# Patient Record
Sex: Female | Born: 1985 | Race: White | Hispanic: No | Marital: Married | Smoking: Never smoker
Health system: Southern US, Community
[De-identification: ages and names within clinical notes are randomized; demographics above are authoritative.]

## PROBLEM LIST (undated history)

## (undated) ENCOUNTER — Inpatient Hospital Stay (HOSPITAL_COMMUNITY): Payer: Self-pay

## (undated) DIAGNOSIS — R519 Headache, unspecified: Secondary | ICD-10-CM

## (undated) DIAGNOSIS — O24419 Gestational diabetes mellitus in pregnancy, unspecified control: Secondary | ICD-10-CM

## (undated) DIAGNOSIS — K219 Gastro-esophageal reflux disease without esophagitis: Secondary | ICD-10-CM

## (undated) DIAGNOSIS — F419 Anxiety disorder, unspecified: Secondary | ICD-10-CM

## (undated) DIAGNOSIS — L509 Urticaria, unspecified: Secondary | ICD-10-CM

## (undated) DIAGNOSIS — E282 Polycystic ovarian syndrome: Secondary | ICD-10-CM

## (undated) DIAGNOSIS — F32A Depression, unspecified: Secondary | ICD-10-CM

## (undated) HISTORY — DX: Gestational diabetes mellitus in pregnancy, unspecified control: O24.419

## (undated) HISTORY — PX: WISDOM TOOTH EXTRACTION: SHX21

## (undated) HISTORY — DX: Urticaria, unspecified: L50.9

## (undated) HISTORY — DX: Polycystic ovarian syndrome: E28.2

---

## 2014-05-06 ENCOUNTER — Other Ambulatory Visit (HOSPITAL_COMMUNITY): Payer: Self-pay | Admitting: Obstetrics & Gynecology

## 2014-05-06 DIAGNOSIS — Z3141 Encounter for fertility testing: Secondary | ICD-10-CM

## 2014-05-07 ENCOUNTER — Ambulatory Visit (HOSPITAL_COMMUNITY): Payer: Self-pay

## 2014-05-07 ENCOUNTER — Ambulatory Visit (HOSPITAL_COMMUNITY)
Admission: RE | Admit: 2014-05-07 | Discharge: 2014-05-07 | Disposition: A | Discharge status: Home / Self Care | Payer: BC Managed Care – PPO | Source: Ambulatory Visit | Attending: Obstetrics & Gynecology | Admitting: Obstetrics & Gynecology

## 2014-05-07 DIAGNOSIS — Z3141 Encounter for fertility testing: Secondary | ICD-10-CM | POA: Diagnosis not present

## 2014-05-07 DIAGNOSIS — N979 Female infertility, unspecified: Secondary | ICD-10-CM | POA: Diagnosis present

## 2014-05-07 MED ORDER — IOHEXOL 300 MG/ML  SOLN
20.0000 mL | Freq: Once | INTRAMUSCULAR | Status: AC | PRN
  Administered 2014-05-07: 20 mL

## 2014-05-10 NOTE — L&D Delivery Note (Signed)
Delivery Note At 11:55 AM a viable female was delivered via Vaginal, Spontaneous Delivery (Presentation: ;  ).  APGAR: 8 9 weight pending Placenta status: Intact, Manual removal.  Cord: 3 vessels with the following complications: None.  Cord pH: *not obtained Tight nuchal cord x 1  Anesthesia:   Episiotomy: None Lacerations: 1st degree Suture Repair: 3.0 chromic Est. Blood Loss (mL):  300  Mom to postpartum.  Baby to Couplet care / Skin to Skin   NICU present for delivery.  Merril Nagy L 03/16/2015, 12:07 PM

## 2014-08-29 LAB — OB RESULTS CONSOLE ABO/RH: RH Type: POSITIVE

## 2014-08-29 LAB — OB RESULTS CONSOLE GC/CHLAMYDIA
Chlamydia: NEGATIVE
Gonorrhea: NEGATIVE

## 2014-08-29 LAB — OB RESULTS CONSOLE RPR: RPR: NONREACTIVE

## 2014-08-29 LAB — OB RESULTS CONSOLE HEPATITIS B SURFACE ANTIGEN: Hepatitis B Surface Ag: NEGATIVE

## 2014-08-29 LAB — OB RESULTS CONSOLE ANTIBODY SCREEN: ANTIBODY SCREEN: NEGATIVE

## 2014-08-29 LAB — OB RESULTS CONSOLE RUBELLA ANTIBODY, IGM: RUBELLA: IMMUNE

## 2014-08-29 LAB — OB RESULTS CONSOLE HIV ANTIBODY (ROUTINE TESTING): HIV: NONREACTIVE

## 2015-01-29 ENCOUNTER — Encounter: Payer: BLUE CROSS/BLUE SHIELD | Attending: Obstetrics & Gynecology

## 2015-01-29 VITALS — Ht 66.0 in | Wt 200.9 lb

## 2015-01-29 DIAGNOSIS — Z713 Dietary counseling and surveillance: Secondary | ICD-10-CM | POA: Insufficient documentation

## 2015-01-29 DIAGNOSIS — O24419 Gestational diabetes mellitus in pregnancy, unspecified control: Secondary | ICD-10-CM | POA: Diagnosis not present

## 2015-01-30 NOTE — Progress Notes (Signed)
  Patient was seen on 01/29/15 for Gestational Diabetes self-management . The following learning objectives were met by the patient :   States the definition of Gestational Diabetes  States why dietary management is important in controlling blood glucose  Describes the effects of carbohydrates on blood glucose levels  Demonstrates ability to create a balanced meal plan  Demonstrates carbohydrate counting   States when to check blood glucose levels  Demonstrates proper blood glucose monitoring techniques  States the effect of stress and exercise on blood glucose levels  States the importance of limiting caffeine and abstaining from alcohol and smoking  Plan:  Aim for 2 Carb Choices per meal (30 grams) +/- 1 either way for breakfast Aim for 3 Carb Choices per meal (45 grams) +/- 1 either way from lunch and dinner Aim for 1-2 Carbs per snack Begin reading food labels for Total Carbohydrate and sugar grams of foods Consider  increasing your activity level by walking daily as tolerated Begin checking BG before breakfast and 1-2 hours after first bit of breakfast, lunch and dinner after  as directed by MD  Take medication  as directed by MD  Blood glucose monitor given: One Touch Verio Flex Lot # [id number] X Exp: 02/2016 Blood glucose reading: $RemoveBeforeDE'134mg'aVTugARGPAlkaTA$ /dl  Patient instructed to monitor glucose levels: FBS: 60 - <90 1 hour: <140 2 hour: <120  Patient received the following handouts:  Nutrition Diabetes and Pregnancy  Carbohydrate Counting List  Meal Planning worksheet  Patient will be seen for follow-up as needed.

## 2015-03-13 ENCOUNTER — Encounter (HOSPITAL_COMMUNITY): Payer: Self-pay | Admitting: *Deleted

## 2015-03-13 ENCOUNTER — Inpatient Hospital Stay (HOSPITAL_COMMUNITY): Payer: BLUE CROSS/BLUE SHIELD

## 2015-03-13 ENCOUNTER — Inpatient Hospital Stay (EMERGENCY_DEPARTMENT_HOSPITAL)
Admission: AD | Admit: 2015-03-13 | Discharge: 2015-03-13 | Disposition: A | Discharge status: Home / Self Care | Payer: BLUE CROSS/BLUE SHIELD | Source: Ambulatory Visit | Attending: Obstetrics and Gynecology | Admitting: Obstetrics and Gynecology

## 2015-03-13 DIAGNOSIS — O99213 Obesity complicating pregnancy, third trimester: Secondary | ICD-10-CM

## 2015-03-13 DIAGNOSIS — Z3689 Encounter for other specified antenatal screening: Secondary | ICD-10-CM

## 2015-03-13 DIAGNOSIS — O42913 Preterm premature rupture of membranes, unspecified as to length of time between rupture and onset of labor, third trimester: Secondary | ICD-10-CM | POA: Diagnosis not present

## 2015-03-13 DIAGNOSIS — O24419 Gestational diabetes mellitus in pregnancy, unspecified control: Secondary | ICD-10-CM

## 2015-03-13 DIAGNOSIS — O368390 Maternal care for abnormalities of the fetal heart rate or rhythm, unspecified trimester, not applicable or unspecified: Secondary | ICD-10-CM

## 2015-03-13 DIAGNOSIS — IMO0002 Reserved for concepts with insufficient information to code with codable children: Secondary | ICD-10-CM

## 2015-03-13 DIAGNOSIS — Z3A35 35 weeks gestation of pregnancy: Secondary | ICD-10-CM

## 2015-03-13 HISTORY — DX: Gestational diabetes mellitus in pregnancy, unspecified control: O24.419

## 2015-03-13 MED ORDER — SODIUM CHLORIDE 0.9 % IV SOLN: INTRAVENOUS | Status: DC

## 2015-03-13 NOTE — MAU Note (Signed)
Pt sent from MD office for 2 decelerations down to the 60's, pt states she has intermittent vaginal pain, denies bleeding or LOF.

## 2015-03-13 NOTE — Discharge Instructions (Signed)
Fetal Movement Counts  Patient Name: __________________________________________________ Patient Due Date: ____________________  Performing a fetal movement count is highly recommended in high-risk pregnancies, but it is good for every pregnant woman to do. Your health care provider may ask you to start counting fetal movements at 28 weeks of the pregnancy. Fetal movements often increase:  · After eating a full meal.  · After physical activity.  · After eating or drinking something sweet or cold.  · At rest.  Pay attention to when you feel the baby is most active. This will help you notice a pattern of your baby's sleep and wake cycles and what factors contribute to an increase in fetal movement. It is important to perform a fetal movement count at the same time each day when your baby is normally most active.   HOW TO COUNT FETAL MOVEMENTS  1. Find a quiet and comfortable area to sit or lie down on your left side. Lying on your left side provides the best blood and oxygen circulation to your baby.  2. Write down the day and time on a sheet of paper or in a journal.  3. Start counting kicks, flutters, swishes, rolls, or jabs in a 2-hour period. You should feel at least 10 movements within 2 hours.  4. If you do not feel 10 movements in 2 hours, wait 2-3 hours and count again. Look for a change in the pattern or not enough counts in 2 hours.  SEEK MEDICAL CARE IF:  · You feel less than 10 counts in 2 hours, tried twice.  · There is no movement in over an hour.  · The pattern is changing or taking longer each day to reach 10 counts in 2 hours.  · You feel the baby is not moving as he or she usually does.  Date: ____________ Movements: ____________ Start time: ____________ Finish time: ____________   Date: ____________ Movements: ____________ Start time: ____________ Finish time: ____________  Date: ____________ Movements: ____________ Start time: ____________ Finish time: ____________  Date: ____________ Movements:  ____________ Start time: ____________ Finish time: ____________  Date: ____________ Movements: ____________ Start time: ____________ Finish time: ____________  Date: ____________ Movements: ____________ Start time: ____________ Finish time: ____________  Date: ____________ Movements: ____________ Start time: ____________ Finish time: ____________  Date: ____________ Movements: ____________ Start time: ____________ Finish time: ____________   Date: ____________ Movements: ____________ Start time: ____________ Finish time: ____________  Date: ____________ Movements: ____________ Start time: ____________ Finish time: ____________  Date: ____________ Movements: ____________ Start time: ____________ Finish time: ____________  Date: ____________ Movements: ____________ Start time: ____________ Finish time: ____________  Date: ____________ Movements: ____________ Start time: ____________ Finish time: ____________  Date: ____________ Movements: ____________ Start time: ____________ Finish time: ____________  Date: ____________ Movements: ____________ Start time: ____________ Finish time: ____________   Date: ____________ Movements: ____________ Start time: ____________ Finish time: ____________  Date: ____________ Movements: ____________ Start time: ____________ Finish time: ____________  Date: ____________ Movements: ____________ Start time: ____________ Finish time: ____________  Date: ____________ Movements: ____________ Start time: ____________ Finish time: ____________  Date: ____________ Movements: ____________ Start time: ____________ Finish time: ____________  Date: ____________ Movements: ____________ Start time: ____________ Finish time: ____________  Date: ____________ Movements: ____________ Start time: ____________ Finish time: ____________   Date: ____________ Movements: ____________ Start time: ____________ Finish time: ____________  Date: ____________ Movements: ____________ Start time: ____________ Finish  time: ____________  Date: ____________ Movements: ____________ Start time: ____________ Finish time: ____________  Date: ____________ Movements: ____________ Start time:   ____________ Finish time: ____________  Date: ____________ Movements: ____________ Start time: ____________ Finish time: ____________  Date: ____________ Movements: ____________ Start time: ____________ Finish time: ____________  Date: ____________ Movements: ____________ Start time: ____________ Finish time: ____________   Date: ____________ Movements: ____________ Start time: ____________ Finish time: ____________  Date: ____________ Movements: ____________ Start time: ____________ Finish time: ____________  Date: ____________ Movements: ____________ Start time: ____________ Finish time: ____________  Date: ____________ Movements: ____________ Start time: ____________ Finish time: ____________  Date: ____________ Movements: ____________ Start time: ____________ Finish time: ____________  Date: ____________ Movements: ____________ Start time: ____________ Finish time: ____________  Date: ____________ Movements: ____________ Start time: ____________ Finish time: ____________   Date: ____________ Movements: ____________ Start time: ____________ Finish time: ____________  Date: ____________ Movements: ____________ Start time: ____________ Finish time: ____________  Date: ____________ Movements: ____________ Start time: ____________ Finish time: ____________  Date: ____________ Movements: ____________ Start time: ____________ Finish time: ____________  Date: ____________ Movements: ____________ Start time: ____________ Finish time: ____________  Date: ____________ Movements: ____________ Start time: ____________ Finish time: ____________  Date: ____________ Movements: ____________ Start time: ____________ Finish time: ____________   Date: ____________ Movements: ____________ Start time: ____________ Finish time: ____________  Date: ____________  Movements: ____________ Start time: ____________ Finish time: ____________  Date: ____________ Movements: ____________ Start time: ____________ Finish time: ____________  Date: ____________ Movements: ____________ Start time: ____________ Finish time: ____________  Date: ____________ Movements: ____________ Start time: ____________ Finish time: ____________  Date: ____________ Movements: ____________ Start time: ____________ Finish time: ____________  Date: ____________ Movements: ____________ Start time: ____________ Finish time: ____________   Date: ____________ Movements: ____________ Start time: ____________ Finish time: ____________  Date: ____________ Movements: ____________ Start time: ____________ Finish time: ____________  Date: ____________ Movements: ____________ Start time: ____________ Finish time: ____________  Date: ____________ Movements: ____________ Start time: ____________ Finish time: ____________  Date: ____________ Movements: ____________ Start time: ____________ Finish time: ____________  Date: ____________ Movements: ____________ Start time: ____________ Finish time: ____________     This information is not intended to replace advice given to you by your health care provider. Make sure you discuss any questions you have with your health care provider.     Document Released: 05/26/2006 Document Revised: 05/17/2014 Document Reviewed: 02/21/2012  Elsevier Interactive Patient Education ©2016 [employer]

## 2015-03-13 NOTE — MAU Provider Note (Signed)
Chief Complaint:  No chief complaint on file.   None     HPI: April Gamble is a 29 y.o. G1P0 at [redacted]w[redacted]d who presents to maternity admissions sent from the office for variable decelerations down to the 60s seen in the office.  She reports some intermittent cramping but reports this is the same as it has been for recent weeks. She has not required any treatment for her cramping and has not taken any medication. She reports she has not had as much water to drink today as usual.  She reports good fetal movement, denies LOF, vaginal bleeding, vaginal itching/burning, urinary symptoms, h/a, dizziness, n/v, or fever/chills.    HPI  Past Medical History: Past Medical History  Diagnosis Date  . Gestational diabetes mellitus, antepartum   . PCOS (polycystic ovarian syndrome)   . Gestational diabetes     Past obstetric history: OB History  Gravida Para Term Preterm AB SAB TAB Ectopic Multiple Living  1             # Outcome Date GA Lbr Len/2nd Weight Sex Delivery Anes PTL Lv  1 Current               Past Surgical History: Past Surgical History  Procedure Laterality Date  . Wisdom tooth extraction      Family History: History reviewed. No pertinent family history.  Social History: Social History  Substance Use Topics  . Smoking status: Never Smoker   . Smokeless tobacco: None  . Alcohol Use: No    Allergies:  Allergies  Allergen Reactions  . Aleve [Naproxen Sodium] Hives  . Amoxicillin Hives  . Biaxin [Clarithromycin] Hives  . Erythromycin Hives  . Sulfa Antibiotics Hives    Meds:  Prescriptions prior to admission  Medication Sig Dispense Refill Last Dose  . acetaminophen (TYLENOL) 325 MG tablet Take 650 mg by mouth every 6 (six) hours as needed for moderate pain or headache.   03/12/2015 at Unknown time  . glyBURIDE (DIABETA) 2.5 MG tablet Take 5 mg by mouth at bedtime.   03/12/2015 at 1900  . Prenatal Multivit-Min-Fe-FA (PRENATAL VITAMINS PO) Take 1 each by mouth daily.    03/12/2015 at 1900    ROS:  Review of Systems  Constitutional: Negative for fever, chills and fatigue.  HENT: Negative for sinus pressure.   Eyes: Negative for photophobia.  Respiratory: Negative for shortness of breath.   Cardiovascular: Negative for chest pain.  Gastrointestinal: Negative for nausea, vomiting, diarrhea and constipation.  Genitourinary: Negative for dysuria, frequency, flank pain, vaginal bleeding, vaginal discharge, difficulty urinating, vaginal pain and pelvic pain.  Musculoskeletal: Negative for neck pain.  Neurological: Negative for dizziness, weakness and headaches.  Psychiatric/Behavioral: Negative.      I have reviewed patient's Past Medical Hx, Surgical Hx, Family Hx, Social Hx, medications and allergies.   Physical Exam   Patient Vitals for the past 24 hrs:  BP Temp Temp src Pulse Resp  03/13/15 1452 115/70 mmHg 98.2 F (36.8 C) Oral 91 18   Constitutional: Well-developed, well-nourished female in no acute distress.  Cardiovascular: normal rate Respiratory: normal effort GI: Abd soft, non-tender, gravid appropriate for gestational age.  MS: Extremities nontender, no edema, normal ROM Neurologic: Alert and oriented x 4.  GU: Neg CVAT.  PELVIC EXAM: Cervix pink, visually closed, without lesion, scant white creamy discharge, vaginal walls and external genitalia normal Bimanual exam: Cervix 0/long/high, firm, anterior, neg CMT, uterus nontender, nonenlarged, adnexa without tenderness, enlargement, or mass  Dilation: 1  Effacement (%): 50 Presentation: Vertex Exam by:: L. Courtney ParisLeftwich Kirby CNM  FHT:  Baseline 135, moderate variability, accelerations present, no decelerations Contractions: q 2-6 mins, irregular, mild to palpation   Labs: No results found for this or any previous visit (from the past 24 hour(s)).    Imaging:  No results found. BPP 8/8,  EFW 83%tile  MAU Course/MDM: FHR tracing reviewed. Consult Dr Henderson Cloudomblin to review FHR tracing.   BPP and Follow up US for EFW ordered.  Consult Dr Henderson Cloudomblin with normal results, BPP 8/8, EFW 83%tile.  Reviewed normal US results with pt.  Pt stable at time of discharge.  Assessment: 1. Fetal heart deceleration   2. Variable fetal heart rate decelerations, antepartum   3. [redacted] weeks gestation of pregnancy   4. Obesity in pregnancy, antepartum, third trimester   5. Gestational diabetes mellitus in third trimester, unspecified diabetic control   6. NST (non-stress test) reactive     Plan: Discharge home Labor precautions and fetal kick counts     Follow-up Information    [facility] Hem[facility] E, [facility].   Specialty:  Obstetrics and Gynecology   Why:  As scheduled   Contact information:   [address] [place] [id number] [phone]       [facility].   Why:  As needed for emergencies   Contact information:   [address] [id number] mc [place] [id number] [phone]       Medication List    TAKE these medications        acetaminophen 325 MG tablet  Commonly known as:  TYLENOL  Take 650 mg by mouth every 6 (six) hours as needed for moderate pain or headache.     glyBURIDE 2.5 MG tablet  Commonly known as:  DIABETA  Take 5 mg by mouth at bedtime.     PRENATAL VITAMINS PO  Take 1 each by mouth daily.        Sharen CounterLisa Leftwich-Kirby Certified Nurse-Midwife 03/13/2015 6:23 PM

## 2015-03-15 ENCOUNTER — Inpatient Hospital Stay (HOSPITAL_COMMUNITY)
Admission: AD | Admit: 2015-03-15 | Discharge: 2015-03-18 | DRG: 775 | Disposition: A | Discharge status: Home / Self Care | Payer: BLUE CROSS/BLUE SHIELD | Source: Ambulatory Visit | Attending: Obstetrics and Gynecology | Admitting: Obstetrics and Gynecology

## 2015-03-15 ENCOUNTER — Encounter (HOSPITAL_COMMUNITY): Payer: Self-pay | Admitting: *Deleted

## 2015-03-15 DIAGNOSIS — Z823 Family history of stroke: Secondary | ICD-10-CM

## 2015-03-15 DIAGNOSIS — O9962 Diseases of the digestive system complicating childbirth: Secondary | ICD-10-CM | POA: Diagnosis present

## 2015-03-15 DIAGNOSIS — Z3A35 35 weeks gestation of pregnancy: Secondary | ICD-10-CM

## 2015-03-15 DIAGNOSIS — O24425 Gestational diabetes mellitus in childbirth, controlled by oral hypoglycemic drugs: Secondary | ICD-10-CM | POA: Diagnosis present

## 2015-03-15 DIAGNOSIS — K219 Gastro-esophageal reflux disease without esophagitis: Secondary | ICD-10-CM | POA: Diagnosis present

## 2015-03-15 DIAGNOSIS — Z833 Family history of diabetes mellitus: Secondary | ICD-10-CM

## 2015-03-15 DIAGNOSIS — O42913 Preterm premature rupture of membranes, unspecified as to length of time between rupture and onset of labor, third trimester: Principal | ICD-10-CM | POA: Diagnosis present

## 2015-03-15 DIAGNOSIS — Z8249 Family history of ischemic heart disease and other diseases of the circulatory system: Secondary | ICD-10-CM

## 2015-03-15 LAB — POCT FERN TEST: POCT FERN TEST: POSITIVE

## 2015-03-15 NOTE — MAU Note (Signed)
Leaking fld since 1930. States fld is clear with sl yellow tinge. Has odor to it but not of urine. One ctx tonight

## 2015-03-15 NOTE — MAU Note (Signed)
Urine sent to lab @2331 .

## 2015-03-15 NOTE — Progress Notes (Signed)
Fern slide made  

## 2015-03-16 ENCOUNTER — Encounter (HOSPITAL_COMMUNITY): Payer: Self-pay | Admitting: Anesthesiology

## 2015-03-16 ENCOUNTER — Inpatient Hospital Stay (HOSPITAL_COMMUNITY): Payer: BLUE CROSS/BLUE SHIELD | Admitting: Anesthesiology

## 2015-03-16 DIAGNOSIS — O42913 Preterm premature rupture of membranes, unspecified as to length of time between rupture and onset of labor, third trimester: Secondary | ICD-10-CM | POA: Diagnosis present

## 2015-03-16 DIAGNOSIS — K219 Gastro-esophageal reflux disease without esophagitis: Secondary | ICD-10-CM | POA: Diagnosis present

## 2015-03-16 DIAGNOSIS — O24425 Gestational diabetes mellitus in childbirth, controlled by oral hypoglycemic drugs: Secondary | ICD-10-CM | POA: Diagnosis present

## 2015-03-16 DIAGNOSIS — Z8249 Family history of ischemic heart disease and other diseases of the circulatory system: Secondary | ICD-10-CM | POA: Diagnosis not present

## 2015-03-16 DIAGNOSIS — O9962 Diseases of the digestive system complicating childbirth: Secondary | ICD-10-CM | POA: Diagnosis present

## 2015-03-16 DIAGNOSIS — Z3A35 35 weeks gestation of pregnancy: Secondary | ICD-10-CM | POA: Diagnosis not present

## 2015-03-16 DIAGNOSIS — Z833 Family history of diabetes mellitus: Secondary | ICD-10-CM | POA: Diagnosis not present

## 2015-03-16 DIAGNOSIS — Z823 Family history of stroke: Secondary | ICD-10-CM | POA: Diagnosis not present

## 2015-03-16 LAB — OB RESULTS CONSOLE GBS: GBS: NEGATIVE

## 2015-03-16 LAB — CBC
HCT: 37.5 % (ref 36.0–46.0)
HEMOGLOBIN: 12.9 g/dL (ref 12.0–15.0)
MCH: 29.9 pg (ref 26.0–34.0)
MCHC: 34.4 g/dL (ref 30.0–36.0)
MCV: 87 fL (ref 78.0–100.0)
Platelets: 269 10*3/uL (ref 150–400)
RBC: 4.31 MIL/uL (ref 3.87–5.11)
RDW: 13.4 % (ref 11.5–15.5)
WBC: 9.7 10*3/uL (ref 4.0–10.5)

## 2015-03-16 LAB — TYPE AND SCREEN
ABO/RH(D): A POS
Antibody Screen: NEGATIVE

## 2015-03-16 LAB — GROUP B STREP BY PCR: Group B strep by PCR: NEGATIVE

## 2015-03-16 LAB — RPR: RPR: NONREACTIVE

## 2015-03-16 LAB — ABO/RH: ABO/RH(D): A POS

## 2015-03-16 MED ORDER — PHENYLEPHRINE 40 MCG/ML (10ML) SYRINGE FOR IV PUSH (FOR BLOOD PRESSURE SUPPORT): 80.0000 ug | PREFILLED_SYRINGE | INTRAVENOUS | Status: DC | PRN

## 2015-03-16 MED ORDER — PHENYLEPHRINE 40 MCG/ML (10ML) SYRINGE FOR IV PUSH (FOR BLOOD PRESSURE SUPPORT)
80.0000 ug | PREFILLED_SYRINGE | INTRAVENOUS | Status: DC | PRN
  Filled 2015-03-16: qty 20

## 2015-03-16 MED ORDER — TERBUTALINE SULFATE 1 MG/ML IJ SOLN
0.2500 mg | Freq: Once | INTRAMUSCULAR | Status: DC | PRN
Start: 2015-03-16 — End: 2015-03-16

## 2015-03-16 MED ORDER — FLEET ENEMA 7-19 GM/118ML RE ENEM: 1.0000 | ENEMA | RECTAL | Status: DC | PRN

## 2015-03-16 MED ORDER — ONDANSETRON HCL 4 MG PO TABS
4.0000 mg | ORAL_TABLET | ORAL | Status: DC | PRN
Start: 2015-03-16 — End: 2015-03-18

## 2015-03-16 MED ORDER — ACETAMINOPHEN 325 MG PO TABS: 650.0000 mg | ORAL_TABLET | ORAL | Status: DC | PRN

## 2015-03-16 MED ORDER — SENNOSIDES-DOCUSATE SODIUM 8.6-50 MG PO TABS
2.0000 | ORAL_TABLET | ORAL | Status: DC
Start: 2015-03-17 — End: 2015-03-18
  Administered 2015-03-16 – 2015-03-17 (×2): 2 via ORAL
  Filled 2015-03-16 (×2): qty 2

## 2015-03-16 MED ORDER — BUPIVACAINE HCL (PF) 0.25 % IJ SOLN
INTRAMUSCULAR | Status: DC | PRN
  Administered 2015-03-16 (×2): 4 mL via EPIDURAL

## 2015-03-16 MED ORDER — OXYCODONE-ACETAMINOPHEN 5-325 MG PO TABS: 1.0000 | ORAL_TABLET | ORAL | Status: DC | PRN

## 2015-03-16 MED ORDER — BISACODYL 10 MG RE SUPP
10.0000 mg | Freq: Every day | RECTAL | Status: DC | PRN
  Filled 2015-03-16: qty 1

## 2015-03-16 MED ORDER — IBUPROFEN 600 MG PO TABS
600.0000 mg | ORAL_TABLET | Freq: Four times a day (QID) | ORAL | Status: DC
  Administered 2015-03-16 – 2015-03-18 (×8): 600 mg via ORAL
  Filled 2015-03-16 (×8): qty 1

## 2015-03-16 MED ORDER — WITCH HAZEL-GLYCERIN EX PADS: 1.0000 "application " | MEDICATED_PAD | CUTANEOUS | Status: DC | PRN

## 2015-03-16 MED ORDER — DIBUCAINE 1 % RE OINT
1.0000 "application " | TOPICAL_OINTMENT | RECTAL | Status: DC | PRN
  Filled 2015-03-16: qty 28

## 2015-03-16 MED ORDER — MEDROXYPROGESTERONE ACETATE 150 MG/ML IM SUSP: 150.0000 mg | INTRAMUSCULAR | Status: DC | PRN

## 2015-03-16 MED ORDER — LACTATED RINGERS IV SOLN
500.0000 mL | INTRAVENOUS | Status: DC | PRN
  Administered 2015-03-16: 500 mL via INTRAVENOUS

## 2015-03-16 MED ORDER — ONDANSETRON HCL 4 MG/2ML IJ SOLN: 4.0000 mg | INTRAMUSCULAR | Status: DC | PRN

## 2015-03-16 MED ORDER — OXYTOCIN BOLUS FROM INFUSION: 500.0000 mL | INTRAVENOUS | Status: DC

## 2015-03-16 MED ORDER — OXYCODONE-ACETAMINOPHEN 5-325 MG PO TABS
1.0000 | ORAL_TABLET | ORAL | Status: DC | PRN
  Administered 2015-03-17: 1 via ORAL
  Filled 2015-03-16: qty 1

## 2015-03-16 MED ORDER — DIPHENHYDRAMINE HCL 25 MG PO CAPS
25.0000 mg | ORAL_CAPSULE | Freq: Four times a day (QID) | ORAL | Status: DC | PRN
  Administered 2015-03-17: 25 mg via ORAL
  Filled 2015-03-16: qty 1

## 2015-03-16 MED ORDER — EPHEDRINE 5 MG/ML INJ: 10.0000 mg | INTRAVENOUS | Status: DC | PRN

## 2015-03-16 MED ORDER — BENZOCAINE-MENTHOL 20-0.5 % EX AERO
1.0000 "application " | INHALATION_SPRAY | CUTANEOUS | Status: DC | PRN
  Administered 2015-03-17: 1 via TOPICAL
  Filled 2015-03-16 (×2): qty 56

## 2015-03-16 MED ORDER — BUTORPHANOL TARTRATE 1 MG/ML IJ SOLN
2.0000 mg | Freq: Once | INTRAMUSCULAR | Status: AC
  Administered 2015-03-16: 2 mg via INTRAVENOUS
  Filled 2015-03-16: qty 2

## 2015-03-16 MED ORDER — LIDOCAINE-EPINEPHRINE (PF) 2 %-1:200000 IJ SOLN
INTRAMUSCULAR | Status: DC | PRN
  Administered 2015-03-16: 3 mL

## 2015-03-16 MED ORDER — SIMETHICONE 80 MG PO CHEW: 80.0000 mg | CHEWABLE_TABLET | ORAL | Status: DC | PRN

## 2015-03-16 MED ORDER — LIDOCAINE HCL (PF) 1 % IJ SOLN: 30.0000 mL | INTRAMUSCULAR | Status: DC | PRN

## 2015-03-16 MED ORDER — LANOLIN HYDROUS EX OINT: TOPICAL_OINTMENT | CUTANEOUS | Status: DC | PRN

## 2015-03-16 MED ORDER — OXYTOCIN 40 UNITS IN LACTATED RINGERS INFUSION - SIMPLE MED
1.0000 m[IU]/min | INTRAVENOUS | Status: DC
  Administered 2015-03-16: 1 m[IU]/min via INTRAVENOUS
  Filled 2015-03-16: qty 1000

## 2015-03-16 MED ORDER — ONDANSETRON HCL 4 MG/2ML IJ SOLN: 4.0000 mg | Freq: Four times a day (QID) | INTRAMUSCULAR | Status: DC | PRN

## 2015-03-16 MED ORDER — OXYCODONE-ACETAMINOPHEN 5-325 MG PO TABS: 2.0000 | ORAL_TABLET | ORAL | Status: DC | PRN

## 2015-03-16 MED ORDER — FLEET ENEMA 7-19 GM/118ML RE ENEM: 1.0000 | ENEMA | Freq: Every day | RECTAL | Status: DC | PRN

## 2015-03-16 MED ORDER — PRENATAL MULTIVITAMIN CH
1.0000 | ORAL_TABLET | Freq: Every day | ORAL | Status: DC
  Administered 2015-03-17 – 2015-03-18 (×2): 1 via ORAL
  Filled 2015-03-16 (×2): qty 1

## 2015-03-16 MED ORDER — TETANUS-DIPHTH-ACELL PERTUSSIS 5-2.5-18.5 LF-MCG/0.5 IM SUSP
0.5000 mL | Freq: Once | INTRAMUSCULAR | Status: DC
  Filled 2015-03-16: qty 0.5

## 2015-03-16 MED ORDER — DIPHENHYDRAMINE HCL 50 MG/ML IJ SOLN: 12.5000 mg | INTRAMUSCULAR | Status: DC | PRN

## 2015-03-16 MED ORDER — ZOLPIDEM TARTRATE 5 MG PO TABS
5.0000 mg | ORAL_TABLET | Freq: Every evening | ORAL | Status: DC | PRN
Start: 2015-03-16 — End: 2015-03-18

## 2015-03-16 MED ORDER — PROMETHAZINE HCL 25 MG/ML IJ SOLN
12.5000 mg | Freq: Once | INTRAMUSCULAR | Status: AC
  Administered 2015-03-16: 12.5 mg via INTRAVENOUS
  Filled 2015-03-16: qty 1

## 2015-03-16 MED ORDER — FENTANYL 2.5 MCG/ML BUPIVACAINE 1/10 % EPIDURAL INFUSION (WH - ANES)
14.0000 mL/h | INTRAMUSCULAR | Status: DC | PRN
  Administered 2015-03-16: 12 mL/h via EPIDURAL
  Filled 2015-03-16: qty 125

## 2015-03-16 MED ORDER — VANCOMYCIN HCL IN DEXTROSE 1-5 GM/200ML-% IV SOLN
1000.0000 mg | Freq: Two times a day (BID) | INTRAVENOUS | Status: DC
  Filled 2015-03-16 (×2): qty 200

## 2015-03-16 MED ORDER — LACTATED RINGERS IV SOLN
INTRAVENOUS | Status: DC
  Administered 2015-03-16: 01:00:00 via INTRAVENOUS

## 2015-03-16 MED ORDER — OXYTOCIN 40 UNITS IN LACTATED RINGERS INFUSION - SIMPLE MED: 62.5000 mL/h | INTRAVENOUS | Status: DC

## 2015-03-16 MED ORDER — CITRIC ACID-SODIUM CITRATE 334-500 MG/5ML PO SOLN: 30.0000 mL | ORAL | Status: DC | PRN

## 2015-03-16 MED ORDER — FENTANYL 2.5 MCG/ML BUPIVACAINE 1/10 % EPIDURAL INFUSION (WH - ANES): 14.0000 mL/h | INTRAMUSCULAR | Status: DC | PRN

## 2015-03-16 MED ORDER — MEASLES, MUMPS & RUBELLA VAC ~~LOC~~ INJ
0.5000 mL | INJECTION | Freq: Once | SUBCUTANEOUS | Status: DC
  Filled 2015-03-16: qty 0.5

## 2015-03-16 NOTE — Progress Notes (Signed)
Dr Vincente PoliGrewal notified of pt's admission and status. Aware of SROM , clear fld, ctx pattern, reactive tracing. Will admit for induction

## 2015-03-16 NOTE — Anesthesia Preprocedure Evaluation (Addendum)
Anesthesia Evaluation  Patient identified by MRN, date of birth, ID band Patient awake    Reviewed: Allergy & Precautions, Patient's Chart, lab work & pertinent test results  Airway Mallampati: III  TM Distance: >3 FB Neck ROM: Full    Dental no notable dental hx. (+) Teeth Intact   Pulmonary neg pulmonary ROS,    Pulmonary exam normal breath sounds clear to auscultation       Cardiovascular negative cardio ROS Normal cardiovascular exam Rhythm:Regular Rate:Normal     Neuro/Psych negative neurological ROS  negative psych ROS   GI/Hepatic Neg liver ROS, GERD  ,  Endo/Other  diabetes, Well Controlled, Gestational, Oral Hypoglycemic AgentsObesity PCOS  Renal/GU negative Renal ROS  negative genitourinary   Musculoskeletal negative musculoskeletal ROS (+)   Abdominal (+) + obese,   Peds  Hematology negative hematology ROS (+)   Anesthesia Other Findings   Reproductive/Obstetrics (+) Pregnancy                            Anesthesia Physical Anesthesia Plan  ASA: II  Anesthesia Plan: Epidural   Post-op Pain Management:    Induction:   Airway Management Planned: Natural Airway  Additional Equipment:   Intra-op Plan:   Post-operative Plan:   Informed Consent: I have reviewed the patients History and Physical, chart, labs and discussed the procedure including the risks, benefits and alternatives for the proposed anesthesia with the patient or authorized representative who has indicated his/her understanding and acceptance.     Plan Discussed with: Anesthesiologist  Anesthesia Plan Comments:         Anesthesia Quick Evaluation

## 2015-03-16 NOTE — Anesthesia Procedure Notes (Signed)
Epidural Patient location during procedure: OB  Staffing Anesthesiologist: Bryler Dibble Performed by: anesthesiologist   Preanesthetic Checklist Completed: patient identified, surgical consent, pre-op evaluation, timeout performed, IV checked, risks and benefits discussed and monitors and equipment checked  Epidural Patient position: sitting Prep: DuraPrep Patient monitoring: heart rate, cardiac monitor, continuous pulse ox and blood pressure Approach: midline Location: L3-L4 Injection technique: LOR saline  Needle:  Needle type: Tuohy  Needle gauge: 17 G Needle length: 9 cm Needle insertion depth: 6 cm Catheter type: closed end flexible Catheter size: 19 Gauge Catheter at skin depth: 12 cm Test dose: negative and 2% lidocaine with Epi 1:200 K  Assessment Events: blood not aspirated, injection not painful, no injection resistance, negative IV test and no paresthesia  Additional Notes Reason for block:procedure for pain   

## 2015-03-16 NOTE — Progress Notes (Signed)
Patient comfortable after epidural  Cervix is 100%/ 5 cm -1 Vertex Scar tissue stretched Follow labor curve Anticipate nsvd

## 2015-03-16 NOTE — H&P (Signed)
April SprangLaura Hodgkin is a 29 y.o. G 1 P 0 at 35 w 3 days presents with PPROM last night.  Rapid GBBS -  History of LEEP Cone Biopsy of cervix. GDM - on Glyburide Maternal Medical History:  Reason for admission: Rupture of membranes and contractions.     OB History    Gravida Para Term Preterm AB TAB SAB Ectopic Multiple Living   1              Past Medical History  Diagnosis Date  . Gestational diabetes mellitus, antepartum   . PCOS (polycystic ovarian syndrome)   . Gestational diabetes    Past Surgical History  Procedure Laterality Date  . Wisdom tooth extraction     Family History: family history includes Cancer in her father; Diabetes in her father; Heart disease in her maternal grandfather and paternal grandfather; Hypertension in her father and mother; Stroke in her maternal grandfather. Social History:  reports that she has never smoked. She does not have any smokeless tobacco history on file. She reports that she does not drink alcohol or use illicit drugs.   Prenatal Transfer Tool  Maternal Diabetes: Yes:  Diabetes Type:  Insulin/Medication controlled Genetic Screening: Normal Maternal Ultrasounds/Referrals: Normal Fetal Ultrasounds or other Referrals:  None Maternal Substance Abuse:  No Significant Maternal Medications:  None Significant Maternal Lab Results:  None Other Comments:  None  Review of Systems  All other systems reviewed and are negative.   Dilation: 1.5 Effacement (%): 70 Station: -3 Exam by:: a. white rn Blood pressure 117/76, pulse 78, temperature 98 F (36.7 C), temperature source Axillary, resp. rate 18, height 5\' 6"  (1.676 m), weight 199 lb (90.266 kg), last menstrual period 04/30/2014, SpO2 98 %. Maternal Exam:  Abdomen: Fetal presentation: vertex     Fetal Exam Fetal State Assessment: Category I - tracings are normal.     Physical Exam  Nursing note and vitals reviewed. Constitutional: She appears well-developed and well-nourished.   HENT:  Head: Normocephalic.  Eyes: Pupils are equal, round, and reactive to light.  Neck: Normal range of motion.  Cardiovascular: Normal rate and regular rhythm.   Respiratory: Effort normal.   Cervix is now 90% 3 cm -2 Vertex Scar tissue palpated from the LEEP  Prenatal labs: ABO, Rh: --/--/A POS, A POS (11/06 0030) Antibody: NEG (11/06 0030) Rubella: Immune (04/21 0000) RPR: Nonreactive (04/21 0000)  HBsAg: Negative (04/21 0000)  HIV: Non-reactive (04/21 0000)  GBS: Negative (11/06 0230)   Assessment/Plan: IUP at 35 w 3 days PPROM GBBS -  Anticipate NSVD Receiving Epidural now NICU at delivery Risks of Preterm delivery discussed with patient   Sher Shampine L 03/16/2015, 6:42 AM

## 2015-03-16 NOTE — MAU Note (Signed)
Report [facility]Leigh Ann [facility]

## 2015-03-17 LAB — CBC
HEMATOCRIT: 36.7 % (ref 36.0–46.0)
Hemoglobin: 12.6 g/dL (ref 12.0–15.0)
MCH: 30.3 pg (ref 26.0–34.0)
MCHC: 34.3 g/dL (ref 30.0–36.0)
MCV: 88.2 fL (ref 78.0–100.0)
PLATELETS: 220 10*3/uL (ref 150–400)
RBC: 4.16 MIL/uL (ref 3.87–5.11)
RDW: 13.7 % (ref 11.5–15.5)
WBC: 12.6 10*3/uL — AB (ref 4.0–10.5)

## 2015-03-17 LAB — GLUCOSE, CAPILLARY: Glucose-Capillary: 87 mg/dL (ref 65–99)

## 2015-03-17 MED ORDER — HYDROCORTISONE 1 % EX CREA
TOPICAL_CREAM | Freq: Two times a day (BID) | CUTANEOUS | Status: DC | PRN
  Administered 2015-03-17: 21:00:00 via TOPICAL
  Filled 2015-03-17: qty 28

## 2015-03-17 NOTE — Progress Notes (Signed)
Post Partum Day 1 Subjective: no complaints, up ad lib, voiding, tolerating PO, + flatus and baby stable in NICU , on nasal canula  Objective: Blood pressure 103/60, pulse 81, temperature 98.8 F (37.1 C), temperature source Oral, resp. rate 18, height 5\' 6"  (1.676 m), weight 199 lb (90.266 kg), last menstrual period 04/30/2014, SpO2 97 %, unknown if currently breastfeeding.  Physical Exam:  General: alert and cooperative Lochia: appropriate Uterine Fundus: firm Incision: healing well DVT Evaluation: No evidence of DVT seen on physical exam. Negative Homan's sign. No cords or calf tenderness. No significant calf/ankle edema.   Recent Labs  03/16/15 0030 03/17/15 0520  HGB 12.9 12.6  HCT 37.5 36.7    Assessment/Plan: Plan for discharge tomorrow   LOS: 1 day   April Gamble G 03/17/2015, 8:24 AM

## 2015-03-17 NOTE — Anesthesia Postprocedure Evaluation (Signed)
  Anesthesia Post-op Note  Patient: April SprangLaura Gamble  Procedure(s) Performed: * No procedures listed *  Patient Location: PACU and Mother/Baby  Anesthesia Type:Epidural  Level of Consciousness: awake, alert  and oriented  Airway and Oxygen Therapy: Patient Spontanous Breathing  Post-op Pain: none  Post-op Assessment: Post-op Vital signs reviewed, Patient's Cardiovascular Status Stable, No headache, No backache, No residual numbness and No residual motor weakness  Post-op Vital Signs: Reviewed and stable  Complications: No apparent anesthesia complications

## 2015-03-17 NOTE — Clinical Social Work Maternal (Signed)
CLINICAL SOCIAL WORK MATERNAL/CHILD NOTE  Patient Details  Name: April Gamble MRN: [id number] Date of Birth: 07/13/1985  Date:  03/17/2015  Clinical Social Worker Initiating Note:  Nusaiba Guallpa E. 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       Strengths:  Ability to meet basic needs , Compliance with medical plan , Home prepared for child , Understanding of illness   Risk Factors/Current Problems:  Mental Health Concerns  (Hx of Anxiety and Depression: MOB reports it started in 2009 and states it was " definitely situational")   Cognitive State:  Alert , Goal Oriented , Insightful , Linear Thinking    Mood/Affect:  Relaxed , Calm , Comfortable , Interested    CSW Assessment: CSW met with MOB and her sister in her third floor room/319 to introduce services, offer support and complete assessment due to baby's admission to NICU at 35.2 weeks.  MOB stated this was a good time to talk and gave permission for CSW to speak openly with sister present.  MOB was pleasant and reports feeling well at this time.  She provided information regarding her baby's NICU admission/medical needs and appears to have a good understanding of the situation.  She acknowledges  that the separation caused by the admission is not ideal, but states she just wants to "make sure she's okay."  CSW encouraged her to keep this frame of mind as baby's hospitalization continues.  She states she does not want baby to go home and have problems she doesn't know how to handle.  CSW agrees.  MOB states feeling comfortable with baby's care thus far.   MOB feels she is coping well emotionally at this time.  She reports a history of Depression in 2009 and states, "it was definitely situational."  MOB did not appear to want to discuss the situation at that time, but assures CSW that it is in the past.  MOB reports taking Celexa at that time and taking Xanax "on and off since then."  She reports no Xanax use during pregnancy.  CSW provided education on perinatal mood disorders and asked MOB to talk with CSW and or her doctor if she has concerns about her mental health at any time.  She agrees.  CSW also encouraged MOB to speak with a Lactation Consultant about safe medications to take in pregnancy for her awareness.  CSW discussed the importance of maternal mental health, even if it means forgoing breast feeding if an unsafe medication is needed.  MOB stated understanding.  She reports no emotional concerns at this time. MOB states she has a   great support system and talked specifically about her husband, sister, parents and PGM.  She noted frustration with the rest of her husband's family who would not make the trip from [place] to be here while she was in labor.  She states her mother-in-law does not typically drive by herself, but did for the delivery because no one would bring her here.  MOB states they have all necessary supplies for baby, but may need to get some more smaller outfits.  MOB states an awareness of SIDS precautions and was able to list them for CSW.   CSW explained ongoing support services offered by NICU CSW and gave contact information.  CSW has no social concerns at this time.  CSW  Plan/Description:  Patient/Family Education , Psychosocial Support and Ongoing Assessment of Needs    Jada Kuhnert Elizabeth, LCSW 03/17/2015, 12:35 PM 

## 2015-03-17 NOTE — Progress Notes (Signed)
CSW attempted to meet with MOB to complete assessment due to baby's admission to NICU and maternal hx of Anxiety and Depression, but she was not in her room at this time.  CSW will attempt again at a later time.

## 2015-03-17 NOTE — Lactation Note (Signed)
This note was copied from the chart of Girl Cameron SprangLaura Erno. Lactation Consultation Note  Patient Name: Girl Cameron SprangLaura Thiam ZOXWR'UToday's Date: 03/17/2015 Reason for consult: Initial assessment;NICU baby;Late preterm infant;Infant < 6lbs    With this mom of a NICU baby, now 7126 hours old. Mom was using 27 flanges, but I decreased her to 24 on her right breast with a better fit. Mom not getting any colostrum with pumping, but was able to express a few large drops with hand expression, mostly from right breast. Baic pumping teaching done, NICU booklet reviewed, hand expression taught. Mom knows to call for questions/concerns  Maternal Data Formula Feeding for Exclusion: Yes (baby in NICU) Has patient been taught Hand Expression?: Yes Does the patient have breastfeeding experience prior to this delivery?: No  Feeding    LATCH Score/Interventions                      Lactation Tools Discussed/Used Tools: Flanges Flange Size: Other (comment) (mom fitted for size 24 on right, 27 on left) WIC Program: No Pump Review: Setup, frequency, and cleaning;Milk Storage;Other (comment) (prremie setting, hand exp, NICU booklet) Initiated by:: bedside RN Date initiated:: 03/16/15   Consult Status Consult Status: Follow-up Date: 03/18/15 Follow-up type: In-patient    Alfred LevinsLee, Corbet Hanley Anne 03/17/2015, 2:35 PM

## 2015-03-18 NOTE — Discharge Summary (Signed)
Obstetric Discharge Summary Reason for Admission: rupture of membranes Prenatal Procedures: none Intrapartum Procedures: spontaneous vaginal delivery Postpartum Procedures: none Complications-Operative and Postpartum: first degree perineal laceration HEMOGLOBIN  Date Value Ref Range Status  03/17/2015 12.6 12.0 - 15.0 g/dL Final   HCT  Date Value Ref Range Status  03/17/2015 36.7 36.0 - 46.0 % Final    Physical Exam:  General: alert Lochia: appropriate Uterine Fundus: firm Incision: na DVT Evaluation: No evidence of DVT seen on physical exam.  Discharge Diagnoses: Term Pregnancy-delivered  Discharge Information: Date: 03/18/2015 Activity: pelvic rest Diet: routine Medications: PNV and Ibuprofen Condition: stable Instructions: refer to practice specific booklet Discharge to: home   Newborn Data: Live born female  Birth Weight: 5 lb 10.8 oz (2575 g) APGAR: 7, 8  Home with mother.  April Gamble 03/18/2015, 7:50 AM

## 2015-03-18 NOTE — Progress Notes (Signed)
Pt is discharged in the care of husband,with N.T. Escort. Denies any pain or discomfort.Spirts are good. No equipment needed for home use. Infant to remain in [facility].Discharged instructions with Rx were given to pt. Questions asked and answered. Stable.

## 2015-03-18 NOTE — Lactation Note (Signed)
This note was copied from the chart of April Gamble. Lactation Consultation Note  Patient Name: April Gamble PPJKD'T Date: 03/18/2015 Reason for consult: Follow-up assessment;NICU baby NICU baby 58 hours old. Met with mom in NICU at baby's bedside. Mom states that she has a DEBP at home. Enc mom to take her pumping kit with her when she is discharged. Mom aware that there are pumping rooms in NICU with hospital-grade DEBP like she had in her room. Enc mom to keep pumping 8 times/24 hours for 15 minutes followed by hand expression. Mom aware of OP/BFSG and [place] phone line assistance after D/C.   Maternal Data    Feeding    LATCH Score/Interventions                      Lactation Tools Discussed/Used     Consult Status Consult Status: PRN    Inocente Salles 03/18/2015, 12:09 PM

## 2015-03-19 ENCOUNTER — Ambulatory Visit: Payer: Self-pay

## 2015-03-19 NOTE — Lactation Note (Signed)
This note was copied from the chart of April Gamble. Lactation Consultation Note  Patient Name: April Gamble JWJXB'JToday's Date: 03/19/2015 Reason for consult: Follow-up assessment;NICU baby  NICU baby 5970 hours old. Mom states that pumping is going well and her EBM is increasing each time that she pumps. Mom states that she is comfortable with hand expression and that she pumps in NICU while visiting baby. Enc mom to make sure she gets some rest and takes care of herself. Enc mom to call for assistance as needed.  Maternal Data    Feeding    LATCH Score/Interventions                      Lactation Tools Discussed/Used     Consult Status Consult Status: PRN    April Gamble, April Gamble 03/19/2015, 10:38 AM

## 2015-03-21 ENCOUNTER — Ambulatory Visit: Payer: Self-pay

## 2015-03-21 NOTE — Lactation Note (Signed)
This note was copied from the chart of April Cameron SprangLaura Wintle. Lactation Consultation Note  Patient Name: April Gamble GNFAO'ZToday's Date: 03/21/2015 Reason for consult: Follow-up assessment;NICU baby  Baby 395 days old. Mom reports that she is having some trouble and thinks her flanges may be the wrong size. Mom using a #27 flange, so switched mom to #24 flanges and mom reports increased comfort. Enc mom to use coconut oil to reduce friction while pumping. Discussed converting a sports bra for hands-free pumping. Mom reports that her breasts are filling. Discussed engorgement prevention/treatment, and enc pumping 15-20 minutes as needed to soften breasts. Had enc mom to use DEBP hospital-grade pump while at hospital, but mom states that her personal pump works better. Maternal Data    Feeding Feeding Type: Breast Milk Length of feed: 30 min  LATCH Score/Interventions                      Lactation Tools Discussed/Used     Consult Status Consult Status: Follow-up Date: 03/22/15 Follow-up type: In-patient    Geralynn OchsWILLIARD, Vint Pola 03/21/2015, 2:42 PM

## 2015-03-27 ENCOUNTER — Ambulatory Visit: Payer: Self-pay

## 2015-03-27 NOTE — Lactation Note (Signed)
This note was copied from the chart of April Marsena Taff. Lactation Consultation Note  Patient Name: April Gamble WYBRK'V Date: 03/27/2015 Reason for consult: Follow-up assessment;NICU baby  NICU baby 39 days old. Mom reports that she is having trouble with one side of her kit that she uses with the hospital DEBP. Assisted mom with reassembling inner center component, and pump working properly. Mom also states that she thinks she may have a "plugged" area near her nipple. Discussed methods of working this area out. Also enc mom to continue with massage, pumping, hand expression, and warm water in order to remove this area to protect supply. Enc mom to offer to put baby to breast as baby able.  Maternal Data    Feeding Feeding Type: Breast Milk Nipple Type: Slow - flow  LATCH Score/Interventions                      Lactation Tools Discussed/Used     Consult Status Consult Status: PRN    Inocente Salles 03/27/2015, 4:30 PM

## 2015-04-01 ENCOUNTER — Ambulatory Visit: Payer: Self-pay

## 2015-04-01 NOTE — Lactation Note (Signed)
This note was copied from the chart of April Cameron SprangLaura Adams. Lactation Consultation Note  Patient Name: April Gamble ZOXWR'UToday's Date: 04/01/2015 Reason for consult: Follow-up assessment;NICU baby;Infant < 6lbs;Late preterm infant   Follow up with mom in NICU. Mom was holding infant at bedside. Mom reports she is pumping every 3 hours, she reports she slept for 8+hours last night without pumping. She is getting 60-120 cc per pumping. Discussed supply and demand and need to continue pumping every 2-3 hours with one 5-6 hour stretch at night in order to maintain milk volume. Mom reports infant has not been put to breast, encouraged her to discuss with NICU about allowing infant to BF. Enc mom to do STS with infant when visiting. Enc mom to call if assistance needed to place infant to breast.    Maternal Data Formula Feeding for Exclusion: No  Feeding Feeding Type: Breast Milk Nipple Type: Slow - flow Length of feed: 20 min  LATCH Score/Interventions                      Lactation Tools Discussed/Used     Consult Status Consult Status: PRN Follow-up type: Call as needed    Ed BlalockSharon S Zephan Beauchaine 04/01/2015, 1:05 PM

## 2015-04-07 ENCOUNTER — Ambulatory Visit: Payer: Self-pay

## 2015-04-07 NOTE — Lactation Note (Signed)
This note was copied from the chart of Girl Cameron SprangLaura Caligiuri. Lactation Consultation Note  Patient Name: Girl Cameron SprangLaura Matar IONGE'XToday's Date: 04/07/2015 Reason for consult: Follow-up assessment;NICU baby NICU baby, 333 weeks old, 603w4d CGA. Called to NICU to assist mom with obtaining a deeper latch. Baby latched and nursing for about 10 minutes prior to this LC's arrival. Demonstrated to mom how to tug baby's chin and flange lower lip--and mom reported increased comfort. Enc mom to keep baby tucked in tight with cheeks touching her breasts. Removed baby from breasts to check mom's nipple and there was a slight compression stripe. Demonstrated to mom how to latch baby more deeply, and then mom able to achieve deeper latch herself. Checked nipple again, and compression stripe barely visible. Mom states that she is comfortable with this position and her ability to latch baby. Mom reports baby probably coming home tomorrow. Mom aware of OP/BFSG and [facility] phone line assistance after D/C. Mom states that she continues to have a good supply of milk, and she had easily expressible milk throughout this BF.   Maternal Data    Feeding Feeding Type: Breast Fed  LATCH Score/Interventions Latch: Grasps breast easily, tongue down, lips flanged, rhythmical sucking.  Audible Swallowing: A few with stimulation Intervention(s): Hand expression  Type of Nipple: Everted at rest and after stimulation  Comfort (Breast/Nipple): Soft / non-tender     Hold (Positioning): Assistance needed to correctly position infant at breast and maintain latch. Intervention(s): Breastfeeding basics reviewed  LATCH Score: 8  Lactation Tools Discussed/Used     Consult Status Consult Status: PRN    Geralynn OchsWILLIARD, Shiane Wenberg 04/07/2015, 3:05 PM

## 2016-04-16 IMAGING — US US MFM FETAL BPP W/O NON-STRESS
1 series · 14 of 28 positions shown · non-contrast
Comparison: none

[Series 1: us mfm fetal bpp w/o non-stress · 63 acquisitions, 14 frames shown]
[im 3/63]
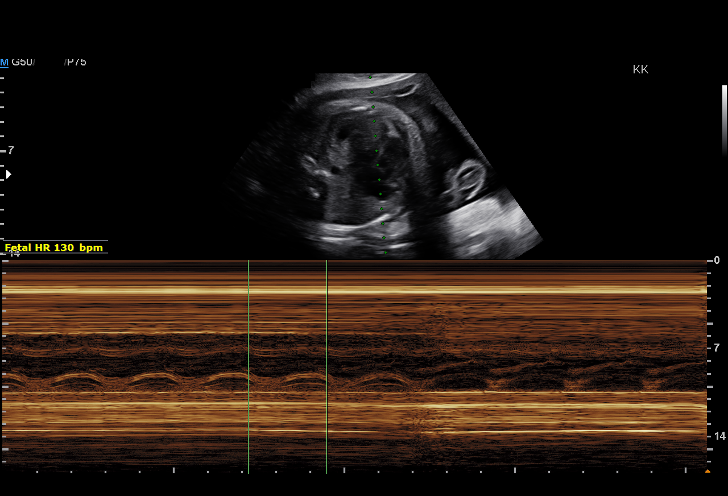
[im 7/63]
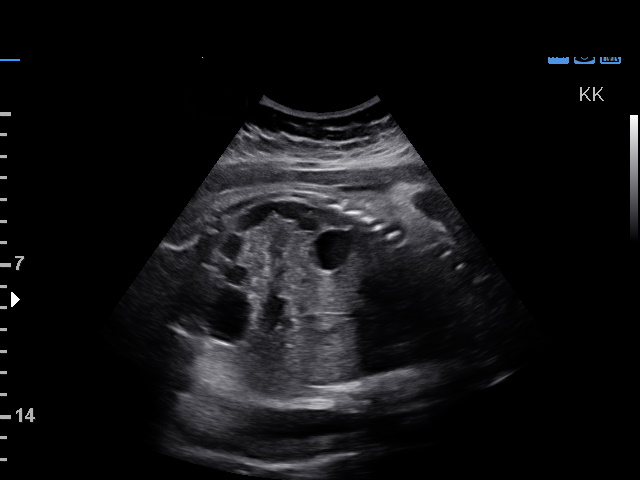
[im 12/63]
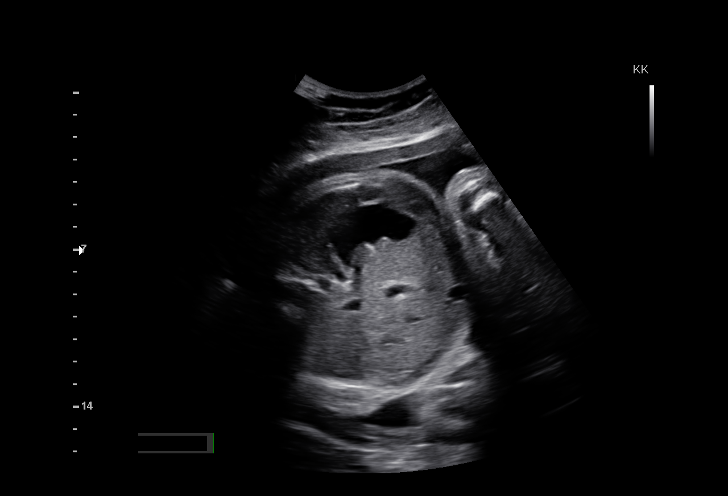
[im 17/63]
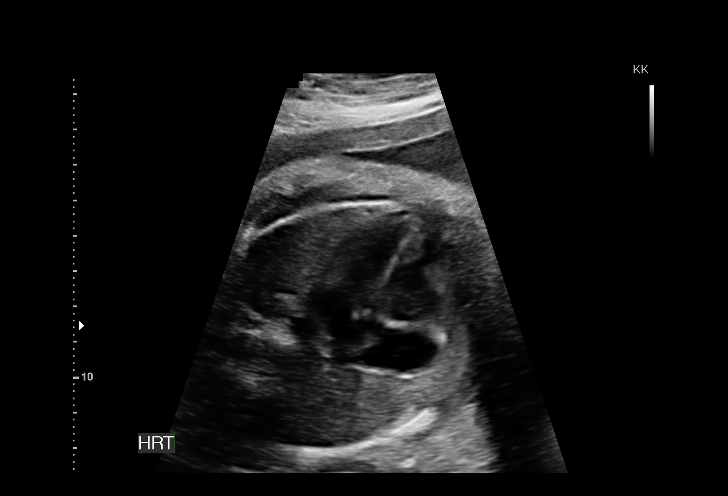
[im 21/63]
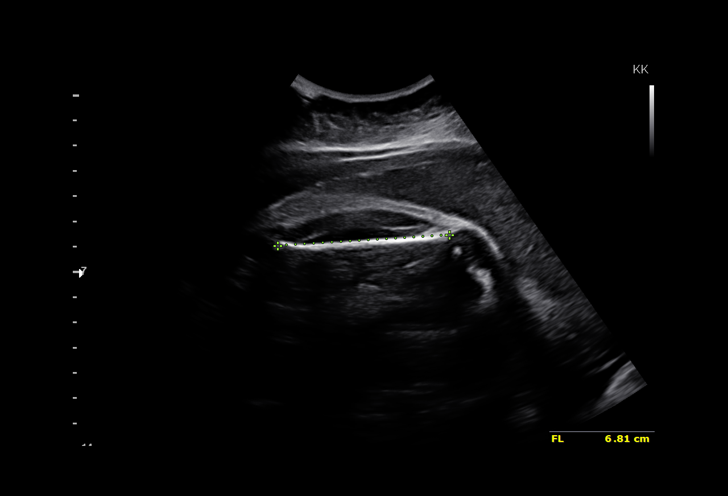
[im 26/63]
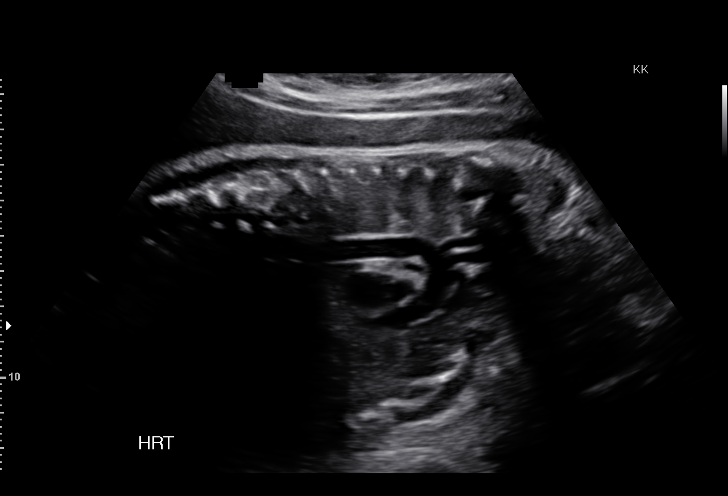
[im 30/63]
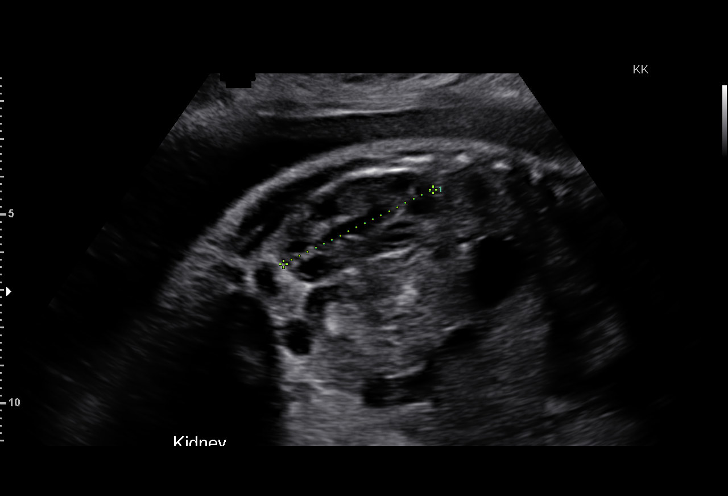
[im 35/63]
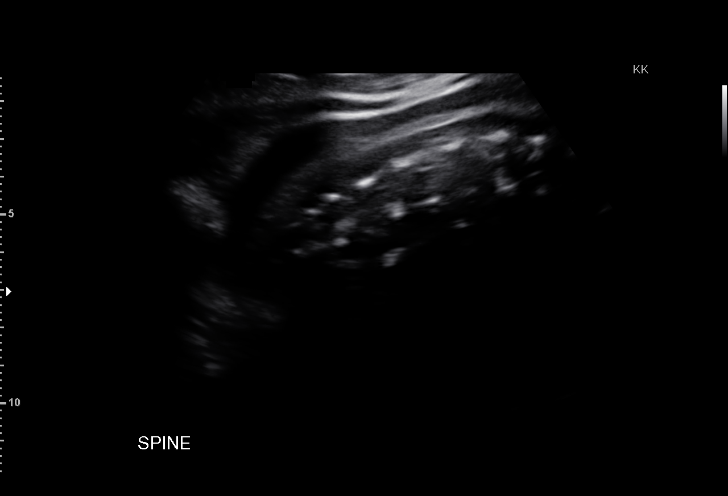
[im 40/63]
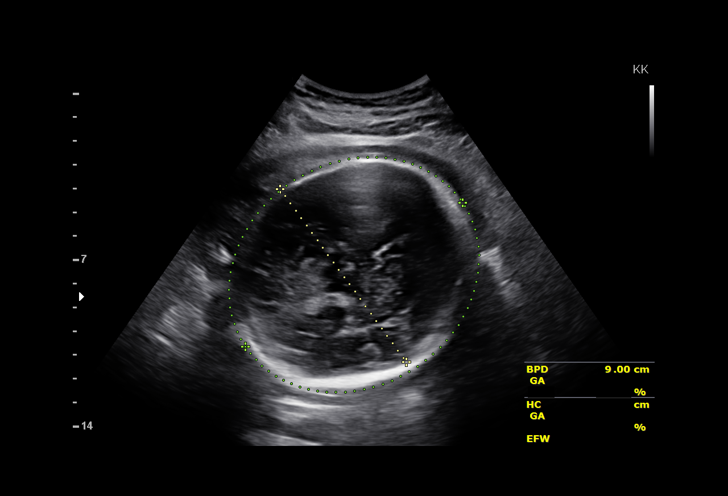
[im 44/63]
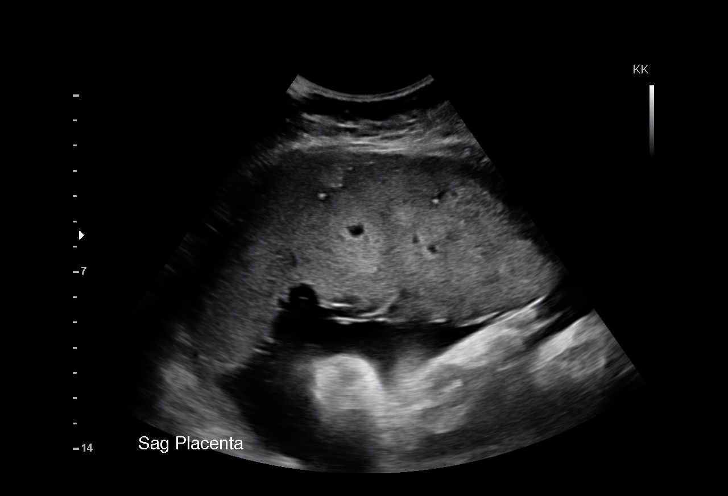
[im 49/63]
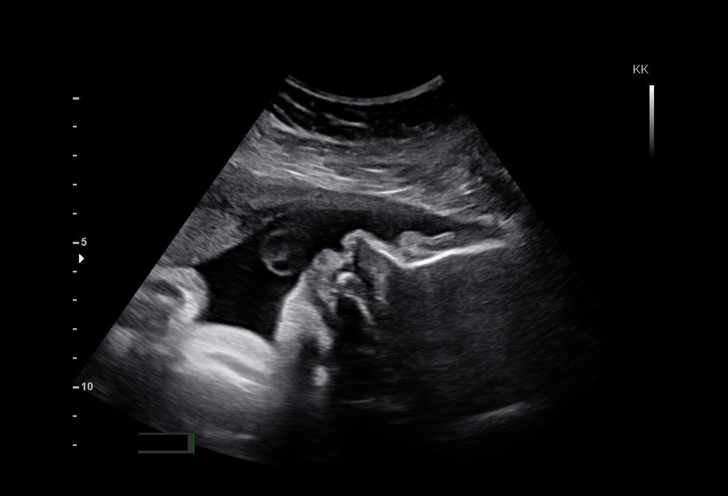
[im 53/63]
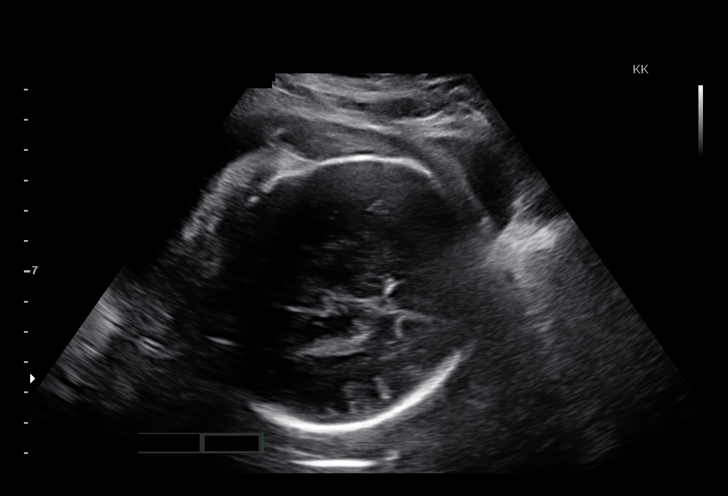
[im 58/63]
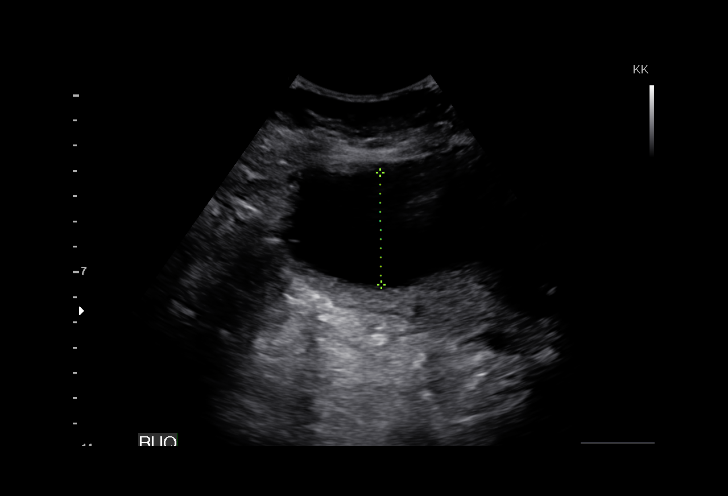
[im 63/63]
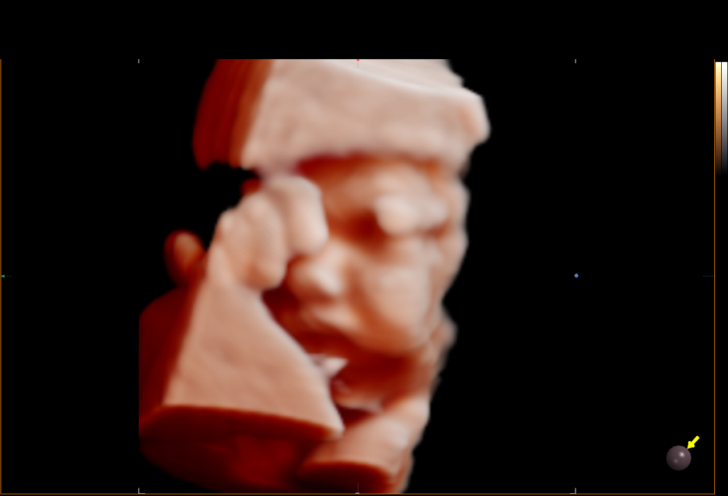

[14 of 28 positions shown; findings below may reference images not displayed]

OBSTETRICS REPORT
(Signed Final 03/14/2015 [DATE])

Date:

Service(s) Provided

Indications

35 weeks gestation of pregnancy
Obesity complicating pregnancy
Non-reactive NST, FHR decelerations
Gestational diabetes in pregnancy, (Glyburide)
Fetal Evaluation

Num Of             1
Fetuses:
Fetal Heart        130                          bpm
Rate:
Cardiac Activity:  Observed
Presentation:      Cephalic
Placenta:          Anterior, above cervical
os
P. Cord            Not well visualized
Insertion:

Amniotic Fluid
AFI FV:      Subjectively within normal limits
AFI Sum:     17.54    cm      65  %Tile     Larg Pckt:    6.56   cm
RUQ:   4.43    cm    RLQ:   6.56    cm   LUQ:    4.14    cm   LLQ:    2.41   cm
Biophysical Evaluation

Amniotic F.V:   Pocket => 2 cm two          F. Tone:        Observed
planes
F. Movement:    Observed                    Score:          [DATE]
F. Breathing:   Observed
Biometry

BPD:     89.4   m    G. Age:   36w 1d                 CI:        78.13   70 - 86
m
FL/HC:      21.8   20.1 -
22.3
HC:       320   m    G. Age:   36w 1d        43  %    HC/AC:      0.98   0.93 -
m
AC:     328.2   m    G. Age:   36w 5d        92  %    FL/BPD      78.1   71 - 87
m                                     :
FL:      69.8   m    G. Age:   35w 6d        64  %    FL/AC:      21.3   20 - 24
m

Est.        2868   gm    6 lb 7 oz      83   %
FW:
Gestational Age

Clinical EDD:  35w 0d                                         EDD:   04/17/15
U/S Today:     36w 1d                                         EDD:   04/09/15
Best:          35w 0d    Det. By:   Clinical EDD              EDD:   04/17/15
Anatomy

Cranium:          Appears normal         Aortic Arch:       Appears normal
Fetal Cavum:      Not well visualized    Ductal Arch:       Appears normal
Ventricles:       Appears normal         Diaphragm:         Appears normal
Choroid Plexus:   Appears normal         Stomach:           Appears normal,
left sided
Cerebellum:       Not well visualized    Abdomen:           Appears normal
Posterior         Not well visualized    Abdominal          Not well visualized
Fossa:                                   Wall:
Nuchal Fold:      Not applicable (>20    Cord Vessels:      Not well visualized
wks GA)
Face:             Orbits appear          Kidneys:           Appear normal
normal
Lips:             Appears normal         Bladder:           Appears normal
Heart:            Appears normal         Spine:             Not well visualized
(4CH, axis, and
situs)
RVOT:             Not well visualized    Lower              Not well visualized
Extremities:
LVOT:             Not well visualized    Upper              Not well visualized
Extremities:

Other:   Complete fetal anatomic survey previously performed in office.
Technically difficult due to maternal habitus and fetal position.
Cervix Uterus Adnexa

Cervix:       Not visualized (advanced GA >43wks)
Impression

Singleton intrauterine pregnancy at 35 weeks 0 days
gestation with fetal cardiac activity
Cephalic presentation
Anterior placenta without evidence of previa
Normal appearing fetal growth and amniotic fluid volume
Limited anatomic survey secondary to advanced
gestational age, maternal habitus and fetal position
BPP [DATE] with an AFI >17 cm
Recommendations

Follow-up ultrasounds as clinically indicated.

questions or concerns.

## 2017-04-06 ENCOUNTER — Encounter: Payer: Managed Care, Other (non HMO) | Attending: Obstetrics & Gynecology | Admitting: Registered"

## 2017-04-06 DIAGNOSIS — R7309 Other abnormal glucose: Secondary | ICD-10-CM

## 2017-04-06 DIAGNOSIS — O99810 Abnormal glucose complicating pregnancy: Secondary | ICD-10-CM | POA: Diagnosis not present

## 2017-04-07 ENCOUNTER — Encounter: Payer: Self-pay | Admitting: Registered"

## 2017-04-07 DIAGNOSIS — R7309 Other abnormal glucose: Secondary | ICD-10-CM | POA: Insufficient documentation

## 2017-04-07 NOTE — Progress Notes (Signed)
Patient was seen on 04/06/2017 for Gestational Diabetes self-management class at the [facility]. The following learning objectives were met by the patient during this course:   States the definition of Gestational Diabetes  States why dietary management is important in controlling blood glucose  Describes the effects each nutrient has on blood glucose levels  Demonstrates ability to create a balanced meal plan  Demonstrates carbohydrate counting   States when to check blood glucose levels  Demonstrates proper blood glucose monitoring techniques  States the effect of stress and exercise on blood glucose levels  States the importance of limiting caffeine and abstaining from alcohol and smoking  Blood glucose monitor given: [employer] Lot # [id number] X Exp: 06/09/2018 Blood glucose reading: 163 (pt states she had [place] food for lunch, but plans to not do again during rest of pregnancy)  Patient instructed to monitor glucose levels: FBS: 60 - <95 1 hour: <140 2 hour: <120  Patient received handouts:  Nutrition Diabetes and Pregnancy  Carbohydrate Counting List  Patient will be seen for follow-up as needed.

## 2017-05-10 NOTE — L&D Delivery Note (Signed)
Delivery Note  SVD viable female Apgars 8,8 over 1st degree ML lac  Placenta delivered spontaneously intact with 3VC. Repair with 2-0 Chromic with good support and hemostasis noted.  R/V exam confirms.  PH art was sent.   Mother and baby to couplet care and are doing well.  EBL 250cc  Candice Campavid Nickoles Gregori, MD

## 2017-06-03 ENCOUNTER — Encounter (HOSPITAL_COMMUNITY): Payer: Self-pay

## 2017-06-03 ENCOUNTER — Observation Stay (HOSPITAL_COMMUNITY)
Admission: AD | Admit: 2017-06-03 | Discharge: 2017-06-05 | Disposition: A | Discharge status: Home / Self Care | Payer: Managed Care, Other (non HMO) | Source: Ambulatory Visit | Attending: Obstetrics & Gynecology | Admitting: Obstetrics & Gynecology

## 2017-06-03 DIAGNOSIS — Z882 Allergy status to sulfonamides status: Secondary | ICD-10-CM | POA: Insufficient documentation

## 2017-06-03 DIAGNOSIS — E282 Polycystic ovarian syndrome: Secondary | ICD-10-CM | POA: Diagnosis not present

## 2017-06-03 DIAGNOSIS — Z3A28 28 weeks gestation of pregnancy: Secondary | ICD-10-CM | POA: Insufficient documentation

## 2017-06-03 DIAGNOSIS — O24414 Gestational diabetes mellitus in pregnancy, insulin controlled: Secondary | ICD-10-CM | POA: Insufficient documentation

## 2017-06-03 DIAGNOSIS — Z794 Long term (current) use of insulin: Secondary | ICD-10-CM | POA: Diagnosis not present

## 2017-06-03 DIAGNOSIS — O4700 False labor before 37 completed weeks of gestation, unspecified trimester: Secondary | ICD-10-CM | POA: Diagnosis present

## 2017-06-03 DIAGNOSIS — Z880 Allergy status to penicillin: Secondary | ICD-10-CM | POA: Insufficient documentation

## 2017-06-03 DIAGNOSIS — Z881 Allergy status to other antibiotic agents status: Secondary | ICD-10-CM | POA: Diagnosis not present

## 2017-06-03 DIAGNOSIS — O09213 Supervision of pregnancy with history of pre-term labor, third trimester: Secondary | ICD-10-CM | POA: Diagnosis not present

## 2017-06-03 DIAGNOSIS — O4703 False labor before 37 completed weeks of gestation, third trimester: Secondary | ICD-10-CM | POA: Diagnosis present

## 2017-06-03 DIAGNOSIS — Z886 Allergy status to analgesic agent status: Secondary | ICD-10-CM | POA: Diagnosis not present

## 2017-06-03 DIAGNOSIS — Z7984 Long term (current) use of oral hypoglycemic drugs: Secondary | ICD-10-CM | POA: Insufficient documentation

## 2017-06-03 DIAGNOSIS — O99283 Endocrine, nutritional and metabolic diseases complicating pregnancy, third trimester: Secondary | ICD-10-CM | POA: Insufficient documentation

## 2017-06-03 LAB — URINALYSIS, ROUTINE W REFLEX MICROSCOPIC
BILIRUBIN URINE: NEGATIVE
Glucose, UA: NEGATIVE mg/dL
Hgb urine dipstick: NEGATIVE
Ketones, ur: 20 mg/dL — AB
Nitrite: NEGATIVE
PROTEIN: NEGATIVE mg/dL
SPECIFIC GRAVITY, URINE: 1.003 — AB (ref 1.005–1.030)
pH: 5 (ref 5.0–8.0)

## 2017-06-03 LAB — CBC
HCT: 40.5 % (ref 36.0–46.0)
Hemoglobin: 14.7 g/dL (ref 12.0–15.0)
MCH: 31.8 pg (ref 26.0–34.0)
MCHC: 36.3 g/dL — AB (ref 30.0–36.0)
MCV: 87.7 fL (ref 78.0–100.0)
PLATELETS: 296 10*3/uL (ref 150–400)
RBC: 4.62 MIL/uL (ref 3.87–5.11)
RDW: 13.4 % (ref 11.5–15.5)
WBC: 13.6 10*3/uL — ABNORMAL HIGH (ref 4.0–10.5)

## 2017-06-03 LAB — FETAL FIBRONECTIN: Fetal Fibronectin: NEGATIVE

## 2017-06-03 LAB — TYPE AND SCREEN
ABO/RH(D): A POS
ANTIBODY SCREEN: NEGATIVE

## 2017-06-03 MED ORDER — BETAMETHASONE SOD PHOS & ACET 6 (3-3) MG/ML IJ SUSP
12.0000 mg | Freq: Once | INTRAMUSCULAR | Status: AC
  Administered 2017-06-03: 12 mg via INTRAMUSCULAR
  Filled 2017-06-03: qty 2

## 2017-06-03 MED ORDER — MAGNESIUM SULFATE 40 G IN LACTATED RINGERS - SIMPLE
2.0000 g/h | INTRAVENOUS | Status: DC
  Administered 2017-06-03 – 2017-06-04 (×2): 2 g/h via INTRAVENOUS
  Filled 2017-06-03 (×2): qty 40

## 2017-06-03 MED ORDER — LACTATED RINGERS IV SOLN
INTRAVENOUS | Status: DC
  Administered 2017-06-03 – 2017-06-05 (×4): via INTRAVENOUS

## 2017-06-03 MED ORDER — MAGNESIUM SULFATE BOLUS VIA INFUSION
6.0000 g | Freq: Once | INTRAVENOUS | Status: AC
  Administered 2017-06-03: 6 g via INTRAVENOUS
  Filled 2017-06-03: qty 500

## 2017-06-03 MED ORDER — CALCIUM CARBONATE ANTACID 500 MG PO CHEW: 2.0000 | CHEWABLE_TABLET | ORAL | Status: DC | PRN

## 2017-06-03 MED ORDER — ACETAMINOPHEN 325 MG PO TABS
650.0000 mg | ORAL_TABLET | ORAL | Status: DC | PRN
  Administered 2017-06-04: 650 mg via ORAL
  Filled 2017-06-03: qty 2

## 2017-06-03 MED ORDER — DOCUSATE SODIUM 100 MG PO CAPS
100.0000 mg | ORAL_CAPSULE | Freq: Every day | ORAL | Status: DC
  Administered 2017-06-04 – 2017-06-05 (×2): 100 mg via ORAL
  Filled 2017-06-03 (×2): qty 1

## 2017-06-03 MED ORDER — PRENATAL MULTIVITAMIN CH
1.0000 | ORAL_TABLET | Freq: Every day | ORAL | Status: DC
  Administered 2017-06-04 – 2017-06-05 (×2): 1 via ORAL
  Filled 2017-06-03 (×2): qty 1

## 2017-06-03 MED ORDER — BETAMETHASONE SOD PHOS & ACET 6 (3-3) MG/ML IJ SUSP
12.0000 mg | Freq: Once | INTRAMUSCULAR | Status: AC
  Administered 2017-06-04: 12 mg via INTRAMUSCULAR
  Filled 2017-06-03: qty 2

## 2017-06-03 MED ORDER — ZOLPIDEM TARTRATE 5 MG PO TABS: 5.0000 mg | ORAL_TABLET | Freq: Every evening | ORAL | Status: DC | PRN

## 2017-06-03 MED ORDER — LACTATED RINGERS IV BOLUS (SEPSIS)
1000.0000 mL | Freq: Once | INTRAVENOUS | Status: AC
  Administered 2017-06-03: 1000 mL via INTRAVENOUS

## 2017-06-03 MED ORDER — NIFEDIPINE 10 MG PO CAPS
10.0000 mg | ORAL_CAPSULE | ORAL | Status: DC | PRN
  Administered 2017-06-03 (×3): 10 mg via ORAL
  Filled 2017-06-03 (×3): qty 1

## 2017-06-03 NOTE — MAU Note (Signed)
Pt started having contractions around 3pm. No LOF or bleeding

## 2017-06-03 NOTE — MAU Provider Note (Signed)
Chief Complaint:  Contractions   First Provider Initiated Contact with Patient 06/03/17 1803      HPI: April Gamble is a 32 y.o. [phone] at 78w4dwho presents to maternity admissions reporting contractions. She reports that she started contracting around 1450 today. She reports the contractions being every 4-5 minutes and having to breath through some contractions. She has a hx of PTD at 35 weeks with last pregnancy.  She reports good fetal movement, denies LOF, vaginal bleeding, vaginal itching/burning, urinary symptoms, h/a, dizziness, n/v, or fever/chills.    Past Medical History: Past Medical History:  Diagnosis Date  . Gestational diabetes   . Gestational diabetes mellitus, antepartum   . PCOS (polycystic ovarian syndrome)     Past obstetric history: OB History  Gravida Para Term Preterm AB Living  4 1   1 2 1   SAB TAB Ectopic Multiple Live Births  2     0 1    # Outcome Date GA Lbr Len/2nd Weight Sex Delivery Anes PTL Lv  4 Current           3 Preterm 03/16/15 [redacted]w[redacted]d 04:54 / 00:31 5 lb 10.8 oz (2.574 kg) F Vag-Spont EPI  LIV  2 SAB           1 SAB               Past Surgical History: Past Surgical History:  Procedure Laterality Date  . WISDOM TOOTH EXTRACTION      Family History: Family History  Problem Relation Age of Onset  . Hypertension Mother   . Diabetes Father   . Cancer Father   . Hypertension Father   . Stroke Maternal Grandfather   . Heart disease Maternal Grandfather   . Heart disease Paternal Grandfather     Social History: Social History   Tobacco Use  . Smoking status: Never Smoker  . Smokeless tobacco: Never Used  Substance Use Topics  . Alcohol use: No  . Drug use: No    Allergies:  Allergies  Allergen Reactions  . Aleve [Naproxen Sodium] Hives    Patient states she takes ibuprofen with no problems  . Amoxicillin Hives  . Biaxin [Clarithromycin] Hives  . Cephalexin Hives  . Erythromycin Hives  . Sulfa Antibiotics Hives    Meds:   Medications Prior to Admission  Medication Sig Dispense Refill Last Dose  . acetaminophen (TYLENOL) 325 MG tablet Take 650 mg by mouth every 6 (six) hours as needed for moderate pain or headache.   06/02/2017 at Unknown time  . glyBURIDE (DIABETA) 2.5 MG tablet Take 2.5 mg by mouth 2 (two) times daily.    06/03/2017 at Unknown time  . Prenatal Multivit-Min-Fe-FA (PRENATAL VITAMINS PO) Take 1 each by mouth daily.   06/02/2017 at Unknown time    ROS:  Review of Systems  Constitutional: Negative.   Respiratory: Negative.   Cardiovascular: Negative.   Gastrointestinal: Positive for abdominal pain. Negative for constipation, diarrhea, nausea and vomiting.       Contractions  Genitourinary: Negative for difficulty urinating, dysuria, frequency, pelvic pain, vaginal bleeding, vaginal discharge and vaginal pain.  Musculoskeletal: Negative.   Neurological: Negative.   Psychiatric/Behavioral: Negative.    I have reviewed patient's Past Medical Hx, Surgical Hx, Family Hx, Social Hx, medications and allergies.   Physical Exam   Patient Vitals for the past 24 hrs:  BP Temp Temp src Pulse Resp SpO2 Weight  06/03/17 1831 115/72 - - 91 - - -  06/03/17 1746 - - - - -  100 % -  06/03/17 1745 118/68 98.4 F (36.9 C) Oral 89 16 - -  06/03/17 1736 - - - - - - 184 lb (83.5 kg)   Constitutional: Well-developed, well-nourished female in no acute distress.  Cardiovascular: normal rate Respiratory: normal effort GI: Abd soft, non-tender, gravid appropriate for gestational age.  MS: Extremities nontender, no edema, normal ROM Neurologic: Alert and oriented x 4.  GU: Neg CVAT.  Cervical Exam: Dilation: 1 Effacement (%): 50 Cervical Position: Posterior Exam by:: Lanice ShirtsV Cadel Stairs CNM  FHT:  Baseline 135 , moderate variability, accelerations present, no decelerations Contractions: q 2-3 mins   Labs: Results for orders placed or performed during the hospital encounter of 06/03/17 (from the past 24 hour(s))   Urinalysis, Routine w reflex microscopic     Status: Abnormal   Collection Time: 06/03/17  5:34 PM  Result Value Ref Range   Color, Urine STRAW (A) YELLOW   APPearance CLEAR CLEAR   Specific Gravity, Urine 1.003 (L) 1.005 - 1.030   pH 5.0 5.0 - 8.0   Glucose, UA NEGATIVE NEGATIVE mg/dL   Hgb urine dipstick NEGATIVE NEGATIVE   Bilirubin Urine NEGATIVE NEGATIVE   Ketones, ur 20 (A) NEGATIVE mg/dL   Protein, ur NEGATIVE NEGATIVE mg/dL   Nitrite NEGATIVE NEGATIVE   Leukocytes, UA MODERATE (A) NEGATIVE   RBC / HPF 0-5 0 - 5 RBC/hpf   WBC, UA 0-5 0 - 5 WBC/hpf   Bacteria, UA RARE (A) NONE SEEN   Squamous Epithelial / LPF 0-5 (A) NONE SEEN   Mucus PRESENT   Fetal fibronectin     Status: None   Collection Time: 06/03/17  6:13 PM  Result Value Ref Range   Fetal Fibronectin NEGATIVE NEGATIVE   MAU Course/MDM: Orders Placed This Encounter  Procedures  . Urinalysis, Routine w reflex microscopic  . Fetal fibronectin  FFN- negative  UA- shows dehydration, Oral fluids pushed  Betamethasone 1 dose given in MAU   Meds ordered this encounter  Medications  . NIFEdipine (PROCARDIA) capsule 10 mg  . betamethasone acetate-betamethasone sodium phosphate (CELESTONE) injection 12 mg   Treatments in MAU included 3 doses of procardia given and fluids given for preterm uterine contractions with no relief of contractions   NST reviewed Consult Dr Langston MaskerMorris with presentation, exam findings and test results. Recommends admission to ante for observation with next dose of betamethasone tomorrow.   Today's evaluation included a work-up for preterm labor which can be life-threatening for both mom and baby.  Assessment: 1. Preterm uterine contractions in third trimester, antepartum    Plan: Admit to Ante for observation Care taken over by Dr Langston MaskerMorris- ordered placed for observation    Steward DroneVeronica Quaran Kedzierski Certified Nurse-Midwife 06/03/2017 6:15 PM

## 2017-06-04 ENCOUNTER — Other Ambulatory Visit: Payer: Self-pay

## 2017-06-04 LAB — GLUCOSE, CAPILLARY
GLUCOSE-CAPILLARY: 118 mg/dL — AB (ref 65–99)
GLUCOSE-CAPILLARY: 148 mg/dL — AB (ref 65–99)
Glucose-Capillary: 133 mg/dL — ABNORMAL HIGH (ref 65–99)
Glucose-Capillary: 167 mg/dL — ABNORMAL HIGH (ref 65–99)

## 2017-06-04 MED ORDER — GLYBURIDE 2.5 MG PO TABS
2.5000 mg | ORAL_TABLET | Freq: Two times a day (BID) | ORAL | Status: DC
  Administered 2017-06-04 – 2017-06-05 (×3): 2.5 mg via ORAL
  Filled 2017-06-04 (×5): qty 1

## 2017-06-04 NOTE — H&P (Signed)
April Gamble is a 32 y.o. female presenting for preterm contractions.  She began having regular CTX yesterday and came to MAU for evaluation.  She was found to be 1 cm dilated and CTX did not improve with IVF and Procardia x 3.  She has antepartum course complicated by previous PTD/PPROM at 35 weeks. She has A2DM on Glyburide 2.5mg  bid.  Last u/s on 1/23 efw 3#3 (93%); CL 4.5cm.  At this time, she reports feeling improvement in frequency and intensity of CTX.  Active FM.  No LOF or VB.   OB History    Gravida Para Term Preterm AB Living   4 1   1 2 1    SAB TAB Ectopic Multiple Live Births   2     0 1     Past Medical History:  Diagnosis Date  . Gestational diabetes   . Gestational diabetes mellitus, antepartum   . PCOS (polycystic ovarian syndrome)   . Preterm labor    Past Surgical History:  Procedure Laterality Date  . WISDOM TOOTH EXTRACTION     Family History: family history includes COPD in her maternal grandfather; Cancer in her father; Diabetes in her father; Heart disease in her maternal grandfather and paternal grandfather; Hypertension in her father, maternal grandfather, and mother; Kidney disease in her maternal grandfather; Stroke in her maternal grandfather. Social History:  reports that  has never smoked. she has never used smokeless tobacco. She reports that she does not drink alcohol or use drugs.     Maternal Diabetes: Yes:  Diabetes Type:  Insulin/Medication controlled Genetic Screening: Normal Maternal Ultrasounds/Referrals: Normal Fetal Ultrasounds or other Referrals:  None Maternal Substance Abuse:  No Significant Maternal Medications:  Meds include: Other: Glyburide Significant Maternal Lab Results:  None Other Comments:  None  ROS Maternal Medical History:  Reason for admission: Contractions.   Contractions: Onset was 13-24 hours ago.   Frequency: irregular.   Perceived severity is mild.    Fetal activity: Perceived fetal activity is normal.   Last  perceived fetal movement was within the past hour.    Prenatal complications: Preterm labor.   Prenatal Complications - Diabetes: gestational. Diabetes is managed by oral agent (monotherapy).      Dilation: 1 Effacement (%): 50 Exam by:: V Rogers CNM Blood pressure (!) 111/59, pulse (!) 106, temperature 97.7 F (36.5 C), temperature source Oral, resp. rate 16, height 5\' 6"  (1.676 m), weight 184 lb (83.5 kg), last menstrual period 11/15/2016, SpO2 99 %, unknown if currently breastfeeding. Maternal Exam:  Uterine Assessment: Contraction strength is mild.  Contraction frequency is rare.   Abdomen: Patient reports no abdominal tenderness. Fundal height is c/w dates.   Estimated fetal weight is 3#3.       Physical Exam  Constitutional: She is oriented to person, place, and time. She appears well-developed and well-nourished.  GI: Soft. There is no rebound.  Neurological: She is alert and oriented to person, place, and time.  Skin: Skin is warm and dry.  Psychiatric: She has a normal mood and affect.    Prenatal labs: ABO, Rh: --/--/A POS (01/25 1945) Antibody: NEG (01/25 1945) Rubella:   RPR:    HBsAg:    HIV:    GBS:     Assessment/Plan: 09UE [id number] at [redacted]w[redacted]d with threatened PTL -BMZ #2 due today -Continue magnesium sulfate  -Plan to d/c magnesium tomorrow if remains stable, check cervix and consider d/c home -A2DM-Glyburide 2.5 bid; adjust dose prn steroid effect  Abygail Galeno  06/04/2017, 9:45 AM

## 2017-06-05 LAB — GLUCOSE, CAPILLARY: Glucose-Capillary: 139 mg/dL — ABNORMAL HIGH (ref 65–99)

## 2017-06-05 NOTE — Discharge Summary (Signed)
    OB Discharge Summary     Patient Name: April SprangLaura Hannum DOB: 30-Jul-1985 MRN: [id number]  Date of admission: 06/03/2017 Delivering MD: This patient has no babies on file.  Date of discharge: 06/05/2017  Admitting diagnosis: 28wks Poss CTX Intrauterine pregnancy: 10236w6d     Secondary diagnosis:  Active Problems:   Threatened preterm labor  Additional problems: none     Discharge diagnosis: Preterm contractions                                                                                                Post partum procedures:n/a  Augmentation: n/a  Complications: None  Hospital course:  The patient was admitted with threatened preterm labor.  After receiving magnesium sulfate and betamethasone course, the patient was feeling no CTX and had no cervical change.  [facility]    Physical exam  Vitals:   06/05/17 0600 06/05/17 0650 06/05/17 0800 06/05/17 1200  BP:   (!) 96/49 (!) 105/56  Pulse:   100 100  Resp: 16 16 18 18   Temp:   98.3 F (36.8 C) 98.3 F (36.8 C)  TempSrc:   Oral Oral  SpO2:   99% 98%  Weight:      Height:       General: alert, cooperative and no distress Lochia: n/a Uterine Fundus: soft Incision: N/A DVT Evaluation: No evidence of DVT seen on physical exam. Negative Homan's sign. No cords or calf tenderness. Labs: Lab Results  Component Value Date   WBC 13.6 (H) 06/03/2017   HGB 14.7 06/03/2017   HCT 40.5 06/03/2017   MCV 87.7 06/03/2017   PLT 296 06/03/2017   No flowsheet data found.  Discharge instruction: per After Visit Summary and "Baby and Me Booklet".  After visit meds:  Allergies as of 06/05/2017      Reactions   Aleve [naproxen Sodium] Hives   Patient states she takes ibuprofen with no problems   Amoxicillin Hives   Biaxin [clarithromycin] Hives   Cephalexin Hives   Erythromycin Hives   Sulfa Antibiotics Hives      Medication List    TAKE these medications   acetaminophen 325 MG  tablet Commonly known as:  TYLENOL Take 650 mg by mouth every 6 (six) hours as needed for moderate pain or headache.   glyBURIDE 2.5 MG tablet Commonly known as:  DIABETA Take 2.5 mg by mouth 2 (two) times daily.   PRENATAL VITAMINS PO Take 1 each by mouth daily.       Diet: carb modified diet  Activity: Advance as tolerated. Pelvic rest for 6 weeks.   Outpatient follow up:Wednesday Follow up Appt:No future appointments. Follow up Visit:No Follow-up on file.  Postpartum contraception: n/a  Newborn Data: This patient has no babies on file. Baby Feeding: n/a Disposition:n/a   06/05/2017 Sandar Krinke, DO

## 2017-06-05 NOTE — Discharge Instructions (Signed)
Call MD for >6 CTX/hr.  Modified bedrest until further evaluation.  Follow up in office at already schedule appointment on Wednesday.  Out of work until Wednesday.

## 2017-06-05 NOTE — Progress Notes (Signed)
Patient continues to feel rare CTX once every few hours.  Active FM. CVX unchanged. Pt to be discharged on modified bedrest and out of work. F/U in office Wednesday.  Mitchel HonourMegan Krysta Bloomfield, DO

## 2017-06-05 NOTE — Progress Notes (Signed)
No current c/o.  Not feeling any CTX.  Magnesium turned off around 7am.  No LOF or VB.  Active FM.  VSS. AF. FHT Cat I Toco flat  Gen: A&O x 3 Abd: soft, NT Ext: no c/c/e  IUP at 2758w6d with threatened PTL, stable -Plan to recheck cvx this pm and consider D/C  April HonourMegan Berea Majkowski, DO

## 2017-07-27 ENCOUNTER — Encounter (HOSPITAL_COMMUNITY): Payer: Self-pay | Admitting: *Deleted

## 2017-07-27 ENCOUNTER — Other Ambulatory Visit: Payer: Self-pay

## 2017-07-27 ENCOUNTER — Inpatient Hospital Stay (HOSPITAL_COMMUNITY): Payer: Managed Care, Other (non HMO) | Admitting: Anesthesiology

## 2017-07-27 ENCOUNTER — Inpatient Hospital Stay (HOSPITAL_COMMUNITY)
Admission: AD | Admit: 2017-07-27 | Discharge: 2017-07-29 | DRG: 807 | Disposition: A | Discharge status: Home / Self Care | Payer: Managed Care, Other (non HMO) | Source: Ambulatory Visit | Attending: Obstetrics and Gynecology | Admitting: Obstetrics and Gynecology

## 2017-07-27 DIAGNOSIS — Z3A37 37 weeks gestation of pregnancy: Secondary | ICD-10-CM

## 2017-07-27 DIAGNOSIS — O24425 Gestational diabetes mellitus in childbirth, controlled by oral hypoglycemic drugs: Principal | ICD-10-CM | POA: Diagnosis present

## 2017-07-27 LAB — CBC
HEMATOCRIT: 38.5 % (ref 36.0–46.0)
Hemoglobin: 13.9 g/dL (ref 12.0–15.0)
MCH: 31.6 pg (ref 26.0–34.0)
MCHC: 36.1 g/dL — ABNORMAL HIGH (ref 30.0–36.0)
MCV: 87.5 fL (ref 78.0–100.0)
Platelets: 232 10*3/uL (ref 150–400)
RBC: 4.4 MIL/uL (ref 3.87–5.11)
RDW: 12.9 % (ref 11.5–15.5)
WBC: 15.1 10*3/uL — AB (ref 4.0–10.5)

## 2017-07-27 LAB — TYPE AND SCREEN
ABO/RH(D): A POS
ANTIBODY SCREEN: NEGATIVE

## 2017-07-27 LAB — GROUP B STREP BY PCR: Group B strep by PCR: NEGATIVE

## 2017-07-27 LAB — RPR: RPR Ser Ql: NONREACTIVE

## 2017-07-27 LAB — GLUCOSE, CAPILLARY: Glucose-Capillary: 116 mg/dL — ABNORMAL HIGH (ref 65–99)

## 2017-07-27 MED ORDER — OXYCODONE-ACETAMINOPHEN 5-325 MG PO TABS: 2.0000 | ORAL_TABLET | ORAL | Status: DC | PRN

## 2017-07-27 MED ORDER — SIMETHICONE 80 MG PO CHEW: 80.0000 mg | CHEWABLE_TABLET | ORAL | Status: DC | PRN

## 2017-07-27 MED ORDER — ACETAMINOPHEN 325 MG PO TABS
650.0000 mg | ORAL_TABLET | ORAL | Status: DC | PRN
  Administered 2017-07-27: 650 mg via ORAL
  Filled 2017-07-27: qty 2

## 2017-07-27 MED ORDER — FENTANYL 2.5 MCG/ML BUPIVACAINE 1/10 % EPIDURAL INFUSION (WH - ANES)
14.0000 mL/h | INTRAMUSCULAR | Status: DC | PRN
  Administered 2017-07-27: 14 mL/h via EPIDURAL

## 2017-07-27 MED ORDER — COCONUT OIL OIL
1.0000 "application " | TOPICAL_OIL | Status: DC | PRN
  Administered 2017-07-27: 1 via TOPICAL
  Filled 2017-07-27: qty 120

## 2017-07-27 MED ORDER — ONDANSETRON HCL 4 MG/2ML IJ SOLN: 4.0000 mg | INTRAMUSCULAR | Status: DC | PRN

## 2017-07-27 MED ORDER — SOD CITRATE-CITRIC ACID 500-334 MG/5ML PO SOLN: 30.0000 mL | ORAL | Status: DC | PRN

## 2017-07-27 MED ORDER — DIPHENHYDRAMINE HCL 25 MG PO CAPS: 25.0000 mg | ORAL_CAPSULE | Freq: Four times a day (QID) | ORAL | Status: DC | PRN

## 2017-07-27 MED ORDER — WITCH HAZEL-GLYCERIN EX PADS: 1.0000 "application " | MEDICATED_PAD | CUTANEOUS | Status: DC | PRN

## 2017-07-27 MED ORDER — EPHEDRINE 5 MG/ML INJ
10.0000 mg | INTRAVENOUS | Status: DC | PRN
  Filled 2017-07-27: qty 2

## 2017-07-27 MED ORDER — DIPHENHYDRAMINE HCL 50 MG/ML IJ SOLN: 12.5000 mg | INTRAMUSCULAR | Status: DC | PRN

## 2017-07-27 MED ORDER — OXYTOCIN BOLUS FROM INFUSION
500.0000 mL | Freq: Once | INTRAVENOUS | Status: AC
  Administered 2017-07-27: 500 mL via INTRAVENOUS

## 2017-07-27 MED ORDER — LACTATED RINGERS IV SOLN: 500.0000 mL | INTRAVENOUS | Status: DC | PRN

## 2017-07-27 MED ORDER — LACTATED RINGERS IV SOLN
INTRAVENOUS | Status: DC
  Administered 2017-07-27: 06:00:00 via INTRAVENOUS

## 2017-07-27 MED ORDER — ACETAMINOPHEN 325 MG PO TABS: 650.0000 mg | ORAL_TABLET | ORAL | Status: DC | PRN

## 2017-07-27 MED ORDER — ONDANSETRON HCL 4 MG PO TABS: 4.0000 mg | ORAL_TABLET | ORAL | Status: DC | PRN

## 2017-07-27 MED ORDER — PHENYLEPHRINE 40 MCG/ML (10ML) SYRINGE FOR IV PUSH (FOR BLOOD PRESSURE SUPPORT)
80.0000 ug | PREFILLED_SYRINGE | INTRAVENOUS | Status: DC | PRN
  Filled 2017-07-27: qty 5

## 2017-07-27 MED ORDER — DIBUCAINE 1 % RE OINT: 1.0000 "application " | TOPICAL_OINTMENT | RECTAL | Status: DC | PRN

## 2017-07-27 MED ORDER — PHENYLEPHRINE 40 MCG/ML (10ML) SYRINGE FOR IV PUSH (FOR BLOOD PRESSURE SUPPORT)
PREFILLED_SYRINGE | INTRAVENOUS | Status: AC
  Filled 2017-07-27: qty 20

## 2017-07-27 MED ORDER — TETANUS-DIPHTH-ACELL PERTUSSIS 5-2.5-18.5 LF-MCG/0.5 IM SUSP: 0.5000 mL | Freq: Once | INTRAMUSCULAR | Status: DC

## 2017-07-27 MED ORDER — FENTANYL 2.5 MCG/ML BUPIVACAINE 1/10 % EPIDURAL INFUSION (WH - ANES)
INTRAMUSCULAR | Status: AC
  Filled 2017-07-27: qty 100

## 2017-07-27 MED ORDER — LACTATED RINGERS IV SOLN: 500.0000 mL | Freq: Once | INTRAVENOUS | Status: DC

## 2017-07-27 MED ORDER — LIDOCAINE HCL (PF) 1 % IJ SOLN
30.0000 mL | INTRAMUSCULAR | Status: DC | PRN
  Filled 2017-07-27: qty 30

## 2017-07-27 MED ORDER — ONDANSETRON HCL 4 MG/2ML IJ SOLN: 4.0000 mg | Freq: Four times a day (QID) | INTRAMUSCULAR | Status: DC | PRN

## 2017-07-27 MED ORDER — IBUPROFEN 600 MG PO TABS
600.0000 mg | ORAL_TABLET | Freq: Four times a day (QID) | ORAL | Status: DC
  Administered 2017-07-27 – 2017-07-29 (×8): 600 mg via ORAL
  Filled 2017-07-27 (×8): qty 1

## 2017-07-27 MED ORDER — ZOLPIDEM TARTRATE 5 MG PO TABS: 5.0000 mg | ORAL_TABLET | Freq: Every evening | ORAL | Status: DC | PRN

## 2017-07-27 MED ORDER — LIDOCAINE HCL (PF) 1 % IJ SOLN
INTRAMUSCULAR | Status: DC | PRN
  Administered 2017-07-27: 7 mL via EPIDURAL
  Administered 2017-07-27: 6 mL via EPIDURAL

## 2017-07-27 MED ORDER — OXYCODONE-ACETAMINOPHEN 5-325 MG PO TABS: 1.0000 | ORAL_TABLET | ORAL | Status: DC | PRN

## 2017-07-27 MED ORDER — OXYTOCIN 40 UNITS IN LACTATED RINGERS INFUSION - SIMPLE MED
2.5000 [IU]/h | INTRAVENOUS | Status: DC
  Filled 2017-07-27: qty 1000

## 2017-07-27 MED ORDER — MEDROXYPROGESTERONE ACETATE 150 MG/ML IM SUSP: 150.0000 mg | INTRAMUSCULAR | Status: DC | PRN

## 2017-07-27 MED ORDER — MEASLES, MUMPS & RUBELLA VAC ~~LOC~~ INJ: 0.5000 mL | INJECTION | Freq: Once | SUBCUTANEOUS | Status: DC

## 2017-07-27 MED ORDER — BENZOCAINE-MENTHOL 20-0.5 % EX AERO
1.0000 "application " | INHALATION_SPRAY | CUTANEOUS | Status: DC | PRN
  Administered 2017-07-27: 1 via TOPICAL
  Filled 2017-07-27: qty 56

## 2017-07-27 MED ORDER — FLEET ENEMA 7-19 GM/118ML RE ENEM: 1.0000 | ENEMA | RECTAL | Status: DC | PRN

## 2017-07-27 MED ORDER — SENNOSIDES-DOCUSATE SODIUM 8.6-50 MG PO TABS
2.0000 | ORAL_TABLET | ORAL | Status: DC
  Administered 2017-07-28 – 2017-07-29 (×2): 2 via ORAL
  Filled 2017-07-27 (×2): qty 2

## 2017-07-27 MED ORDER — PRENATAL MULTIVITAMIN CH
1.0000 | ORAL_TABLET | Freq: Every day | ORAL | Status: DC
  Administered 2017-07-27 – 2017-07-28 (×2): 1 via ORAL
  Filled 2017-07-27 (×2): qty 1

## 2017-07-27 NOTE — H&P (Signed)
April SprangLaura Gamble is a 32 y.o. female presenting for labor sxs.  Pregnancy complicated by A2Dm controlled on glyburide. Also, PTL admitted previously for Magnesium and BMZ series.  GBS-. OB History    Gravida Para Term Preterm AB Living   4 1   1 2 1    SAB TAB Ectopic Multiple Live Births   2     0 1     Past Medical History:  Diagnosis Date  . Gestational diabetes   . Gestational diabetes mellitus, antepartum   . PCOS (polycystic ovarian syndrome)   . Preterm labor    Past Surgical History:  Procedure Laterality Date  . WISDOM TOOTH EXTRACTION     Family History: family history includes COPD in her maternal grandfather; Cancer in her father; Diabetes in her father; Heart disease in her maternal grandfather and paternal grandfather; Hypertension in her father, maternal grandfather, and mother; Kidney disease in her maternal grandfather; Stroke in her maternal grandfather. Social History:  reports that  has never smoked. she has never used smokeless tobacco. She reports that she does not drink alcohol or use drugs.     Maternal Diabetes: Yes:  Diabetes Type:  Insulin/Medication controlled Genetic Screening: Normal Maternal Ultrasounds/Referrals: Normal Fetal Ultrasounds or other Referrals:  None Maternal Substance Abuse:  No Significant Maternal Medications:  Meds include: Other: GLyburide Significant Maternal Lab Results:  None Other Comments:  None  ROS History Dilation: 8.5 Effacement (%): 100 Station: -1 Exam by:: CGoodnight,RN  Blood pressure 104/66, pulse 85, temperature 98 F (36.7 C), resp. rate 18, last menstrual period 11/15/2016, SpO2 99 %, unknown if currently breastfeeding. Exam Physical Exam  Prenatal labs: ABO, Rh: --/--/A POS (03/20 0547) Antibody: NEG (03/20 0547) Rubella:   RPR:    HBsAg:    HIV:    GBS:     Assessment/Plan: IUP at term Active labor.  Anticipate SVD A2DM - will monitor and treat if necessary in labor   Yonah Tangeman C 07/27/2017, 8:33  AM

## 2017-07-27 NOTE — MAU Note (Signed)
Pt reports uc's all day yesterday and strong UC's since midnight. Some blood noted w/ wiping this morning.

## 2017-07-27 NOTE — Lactation Note (Signed)
This note was copied from a baby's chart. Lactation Consultation Note  Patient Name: Boy Cameron SprangLaura Vanzee JXBJY'NToday's Date: 07/27/2017 Reason for consult: Initial assessment;Late-preterm 34-36.6wks Baby is 4 hours old and has been to the breast twice.  Baby is 36.2 weeks.  Mom has started pumping and taught hand expression by RN.  Discussed late preterm feeding behavior.  Baby's blood sugars normal x 2.  Baby is currently sleeping in FOB's arms.  Recommended skin to skin as much as possible to promote infant feeding.  Instructed to feed with cues or at least every 3 hours.  Encouraged to call for assist prn.  Maternal Data Has patient been taught Hand Expression?: Yes Does the patient have breastfeeding experience prior to this delivery?: Yes  Feeding Feeding Type: Breast Fed Length of feed: 20 min  LATCH Score Latch: Grasps breast easily, tongue down, lips flanged, rhythmical sucking.  Audible Swallowing: A few with stimulation  Type of Nipple: Everted at rest and after stimulation  Comfort (Breast/Nipple): Soft / non-tender  Hold (Positioning): Full assist, staff holds infant at breast  LATCH Score: 7  Interventions    Lactation Tools Discussed/Used Tools: Pump Breast pump type: Double-Electric Breast Pump(LPTP) Pump Review: Setup, frequency, and cleaning;Milk Storage Initiated by:: RN Date initiated:: 07/27/17   Consult Status Consult Status: Follow-up Date: 07/28/17 Follow-up type: In-patient    Huston FoleyMOULDEN, Mailin S 07/27/2017, 2:15 PM

## 2017-07-27 NOTE — Progress Notes (Signed)
Transfer patient to MBU Rm 148 via WC

## 2017-07-27 NOTE — Anesthesia Postprocedure Evaluation (Signed)
Anesthesia Post Note  Patient: April Gamble  Procedure(s) Performed: AN AD HOC LABOR EPIDURAL     Patient location during evaluation: Mother Baby Anesthesia Type: Epidural Level of consciousness: awake Pain management: pain level controlled Vital Signs Assessment: post-procedure vital signs reviewed and stable Respiratory status: spontaneous breathing Cardiovascular status: stable Postop Assessment: no headache, no backache, epidural receding, patient able to bend at knees, no apparent nausea or vomiting and adequate PO intake Anesthetic complications: no    Last Vitals:  Vitals:   07/27/17 1101 07/27/17 1130  BP: 109/62 112/63  Pulse: 87 86  Resp:  18  Temp:  36.8 C  SpO2:      Last Pain:  Vitals:   07/27/17 1130  TempSrc: Oral  PainSc:    Pain Goal:                 Sheryn Aldaz

## 2017-07-27 NOTE — Anesthesia Preprocedure Evaluation (Signed)
Anesthesia Evaluation  Patient identified by MRN, date of birth, ID band Patient awake    Reviewed: Allergy & Precautions, H&P , NPO status , Patient's Chart, lab work & pertinent test results  Airway Mallampati: I  TM Distance: >3 FB Neck ROM: full    Dental no notable dental hx. (+) Teeth Intact   Pulmonary neg pulmonary ROS,    Pulmonary exam normal breath sounds clear to auscultation       Cardiovascular negative cardio ROS Normal cardiovascular exam     Neuro/Psych negative neurological ROS  negative psych ROS   GI/Hepatic negative GI ROS, Neg liver ROS,   Endo/Other  diabetes, Gestational  Renal/GU negative Renal ROS  negative genitourinary   Musculoskeletal negative musculoskeletal ROS (+)   Abdominal Normal abdominal exam  (+)   Peds  Hematology negative hematology ROS (+)   Anesthesia Other Findings   Reproductive/Obstetrics (+) Pregnancy                             Anesthesia Physical Anesthesia Plan  ASA: II  Anesthesia Plan: Epidural   Post-op Pain Management:    Induction:   PONV Risk Score and Plan:   Airway Management Planned:   Additional Equipment:   Intra-op Plan:   Post-operative Plan:   Informed Consent: I have reviewed the patients History and Physical, chart, labs and discussed the procedure including the risks, benefits and alternatives for the proposed anesthesia with the patient or authorized representative who has indicated his/her understanding and acceptance.     Plan Discussed with:   Anesthesia Plan Comments:         Anesthesia Quick Evaluation

## 2017-07-27 NOTE — Anesthesia Procedure Notes (Signed)
Epidural Patient location during procedure: OB Start time: 07/27/2017 6:33 AM End time: 07/27/2017 6:36 AM  Staffing Anesthesiologist: Leilani AbleHatchett, Ernie Sagrero, MD Performed: anesthesiologist   Preanesthetic Checklist Completed: patient identified, site marked, surgical consent, pre-op evaluation, timeout performed, IV checked, risks and benefits discussed and monitors and equipment checked  Epidural Patient position: sitting Prep: site prepped and draped and DuraPrep Patient monitoring: continuous pulse ox and blood pressure Approach: midline Location: L3-L4 Injection technique: LOR air  Needle:  Needle type: Tuohy  Needle gauge: 17 G Needle length: 9 cm and 9 Needle insertion depth: 6 cm Catheter type: closed end flexible Catheter size: 19 Gauge Catheter at skin depth: 11 cm Test dose: negative and Other  Assessment Sensory level: T9 Events: blood not aspirated, injection not painful, no injection resistance, negative IV test and no paresthesia  Additional Notes Reason for block:procedure for pain

## 2017-07-27 NOTE — Anesthesia Pain Management Evaluation Note (Signed)
  CRNA Pain Management Visit Note  Patient: April SprangLaura Jackson, 32 y.o., female  "Hello I am a member of the anesthesia team [facility]. We have an anesthesia team available at all times to provide care throughout the hospital, including epidural management and anesthesia for C-section. I don't know your plan for the delivery whether it a natural birth, water birth, IV sedation, nitrous supplementation, doula or epidural, but we want to meet your pain goals."   1.Was your pain managed to your expectations on prior hospitalizations?   Yes   2.What is your expectation for pain management during this hospitalization?     Epidural  3.How can we help you reach that goal? Epidural in place  Record the patient's initial score and the patient's pain goal.   Pain: 6  Pain Goal: 6 [facility] you to be able to say your pain was always managed very well.  [name] 07/27/2017

## 2017-07-28 LAB — CBC
HCT: 38.8 % (ref 36.0–46.0)
Hemoglobin: 13.3 g/dL (ref 12.0–15.0)
MCH: 30.9 pg (ref 26.0–34.0)
MCHC: 34.3 g/dL (ref 30.0–36.0)
MCV: 90 fL (ref 78.0–100.0)
PLATELETS: 239 10*3/uL (ref 150–400)
RBC: 4.31 MIL/uL (ref 3.87–5.11)
RDW: 13.5 % (ref 11.5–15.5)
WBC: 12.4 10*3/uL — AB (ref 4.0–10.5)

## 2017-07-28 LAB — BIRTH TISSUE RECOVERY COLLECTION (PLACENTA DONATION)

## 2017-07-28 MED ORDER — ACETAMINOPHEN 325 MG PO TABS: 650.0000 mg | ORAL_TABLET | Freq: Four times a day (QID) | ORAL | 0 refills | Status: DC | PRN

## 2017-07-28 MED ORDER — BENZOCAINE-MENTHOL 20-0.5 % EX AERO: 1.0000 "application " | INHALATION_SPRAY | CUTANEOUS | 1 refills | Status: DC | PRN

## 2017-07-28 MED ORDER — IBUPROFEN 600 MG PO TABS: 600.0000 mg | ORAL_TABLET | Freq: Four times a day (QID) | ORAL | 0 refills | Status: DC | PRN

## 2017-07-28 MED ORDER — PRENATAL MULTIVITAMIN CH: 1.0000 | ORAL_TABLET | Freq: Every day | ORAL | 3 refills | Status: DC

## 2017-07-28 NOTE — Discharge Summary (Signed)
Obstetric Discharge Summary Reason for Admission: onset of labor Prenatal Procedures: none Intrapartum Procedures: spontaneous vaginal delivery Postpartum Procedures: none Complications-Operative and Postpartum: none Hemoglobin  Date Value Ref Range Status  07/28/2017 13.3 12.0 - 15.0 g/dL Final   HCT  Date Value Ref Range Status  07/28/2017 38.8 36.0 - 46.0 % Final    Physical Exam:  General: alert, cooperative and no distress Lochia: appropriate Uterine Fundus: firm Incision: healing well DVT Evaluation: No evidence of DVT seen on physical exam.  Discharge Diagnoses: Term Pregnancy-delivered  Discharge Information: Date: 07/28/2017 Activity: pelvic rest Diet: routine Medications: PNV and Ibuprofen Condition: stable Instructions: refer to practice specific booklet Discharge to: home   Newborn Data: Live born female  Birth Weight: 6 lb 11.1 oz (3036 g) APGAR: 8, 8  Newborn Delivery   Birth date/time:  07/27/2017 09:18:00 Delivery type:  Vaginal, Spontaneous     Home with mother.  Roselle LocusJames E Deidrea Gaetz II 07/28/2017, 8:03 AM

## 2017-07-28 NOTE — Lactation Note (Signed)
This note was copied from a baby's chart. Lactation Consultation Note Baby 20 hrs old. RN concerned baby hasn't fed hardly. LC entered HOT rm.. Mom stated she was burning up and rm. Temperature hard to regulate. LC turned heat down. Mom stated baby is sleepy. LC changed wet diaper. Baby had on 2 t-shirts (which was like a sleeper). And a blanket. Mom stated baby had a low temp earlier but has regulated since. In football position attempted to latch, baby started gagging and trying to regurgitate, had some bubbles in mouth, suctioned. Noted abd. Slightly distended. Discussed w/mom why baby wasn't hungry and gagging. Encouraged to try to BF or hand express and give colostrum w/spoon to stimulate baby to feed.   Mom has tubular breast w/everted compressible nipples. Hand expression w/colostrum noted. Mom has DEBP at bedside. Encouraged to pump for stimulation.  Reviewed LPI information. D/t baby over 6 lbs, may not need to supplement after BF well. encouraged to until BF well for stimulation and calories.  Reported to RN.   Patient Name: April Cameron SprangLaura Gamble [id number]'s Date: 07/28/2017 Reason for consult: Follow-up assessment;Maternal endocrine disorder;Late-preterm 34-36.6wks;Infant < 6lbs Type of Endocrine Disorder?: Diabetes(and {COS)   Maternal Data    Feeding Feeding Type: Breast Milk Length of feed: 10 min  LATCH Score Latch: Too sleepy or reluctant, no latch achieved, no sucking elicited.     Type of Nipple: Everted at rest and after stimulation  Comfort (Breast/Nipple): Soft / non-tender  Hold (Positioning): Assistance needed to correctly position infant at breast and maintain latch.     Interventions Interventions: Breast feeding basics reviewed;Support pillows;Assisted with latch;Position options;Skin to skin;Breast massage;Expressed milk;Hand express;Breast compression;DEBP;Adjust position  Lactation Tools Discussed/Used     Consult Status Consult Status: Follow-up Date:  07/28/17 Follow-up type: In-patient    Charyl DancerCARVER, Jaidalyn G 07/28/2017, 5:43 AM

## 2017-07-28 NOTE — Progress Notes (Signed)
Post Partum Day 1 Subjective: no complaints, up ad lib, voiding, tolerating PO and + flatus  Objective: Blood pressure 96/64, pulse 65, temperature 98.2 F (36.8 C), temperature source Oral, resp. rate 18, last menstrual period 11/15/2016, SpO2 97 %, unknown if currently breastfeeding.  Physical Exam:  General: alert, cooperative and no distress Lochia: appropriate Uterine Fundus: firm Incision: healing well DVT Evaluation: No evidence of DVT seen on physical exam.  Recent Labs    07/27/17 0547 07/28/17 0506  HGB 13.9 13.3  HCT 38.5 38.8    Assessment/Plan: Discharge home  D/W patient female infant circumcision-risks-all questions answered. Patient states she understands and agrees   LOS: 1 day   Roselle LocusJames E Naiyana Barbian II 07/28/2017, 7:52 AM

## 2017-07-29 ENCOUNTER — Ambulatory Visit: Payer: Self-pay

## 2017-07-29 NOTE — Lactation Note (Signed)
This note was copied from a baby's chart. Lactation Consultation Note  Patient Name: April Cameron SprangLaura Gamble ZOXWR'UToday's Date: 07/29/2017 Reason for consult: Follow-up assessment;Infant weight loss;Late-preterm 34-36.6wks;Hyperbilirubinemia;Other (Comment) Type of Endocrine Disorder?: PCOS    P2 mom bottle feeding infant LPT infant on bili light.  Infant took 14 ml. Of formula.  Then spit up small amount after feed.  Mom states she has not pumped since yesterday and had not seen any milk.  LC reviewed hand expression with mom and collected 2-3 ml into snappie colostrum container for next feed.  Mom returned demonstration and milk easily flowed from both breasts.  She states right nipple is slightly tender and she has been using coconut oil.  Mom has a curved tip syringe and knows how to finger feed with the tool and LC encouraged mom to feed back to infant with next feed. Pediatrician walked in and mom also had a food tray delivered so mom stopped hand expressing.  LC encouraged mom to hand express prior to and after feeds and also before and after pumping.  Mom knows the importance of pumping to stimulate her milk supply.  Snappies left in room for collecting.  LC encouraged mom to call out for assistance with next BF.         Maternal Data    Feeding Feeding Type: Formula  LATCH Score                   Interventions Interventions: Breast feeding basics reviewed;DEBP;Hand express  Lactation Tools Discussed/Used     Consult Status Consult Status: Follow-up Date: 07/30/17 Follow-up type: In-patient    Maryruth HancockKelly Suzanne [facility] 07/29/2017, 9:14 AM

## 2017-07-29 NOTE — Lactation Note (Signed)
This note was copied from a baby's chart. Lactation Consultation Note  Patient Name: Boy Cameron SprangLaura Cui ZOXWR'UToday's Date: 07/29/2017   Mom prefers finger-feeding over bottle-feeding at this time. Mom says she has no questions about pumping. Mom did not seem interested in talking.  [name], Dwanna Goshert [facility] 07/29/2017, 5:34 PM

## 2017-07-29 NOTE — Lactation Note (Signed)
This note was copied from a baby's chart. Lactation Consultation Note  Patient Name: April Cameron SprangLaura Gamble ZOXWR'UToday's Date: 07/29/2017 Reason for consult: Follow-up assessment;Late-preterm 34-36.6wks  LPI feeding policy initiated. Formula started. Infant only took 5mL before falling asleep. Mom knows she can offer more formula from same bottle if he cues to feed within the hour. RN was made aware & asked to go into room to see if infant will take more. Dr. Ave Filterhandler made aware of change in feeding plan.   April HareRichey, [facility] Gamble [facility] 07/29/2017, 7:56 AM

## 2017-07-30 ENCOUNTER — Ambulatory Visit: Payer: Self-pay

## 2017-07-30 NOTE — Lactation Note (Signed)
This note was copied from a baby's chart. Lactation Consultation Note  Patient Name: April Cameron SprangLaura Lorton ZOXWR'UToday's Date: 07/30/2017  Pecola LeisureBaby is 1671 hours old and at a 11% weight loss.  Baby receiving double phototherapy.  Mom is supplementing 20 mls of breast milk every 4-5 hours.  Discussed this morning that due to weight loss baby should receive 30+ mls every 3 hours.  Mom recently pumped 60 mls.  Recommended slow flow nipple now that volumes have increased.  Mom agreeable.  Encouraged to call out for assist prn.   Maternal Data    Feeding    LATCH Score                   Interventions    Lactation Tools Discussed/Used     Consult Status      Huston FoleyMOULDEN, Jonise S 07/30/2017, 9:01 AM

## 2017-07-31 NOTE — Progress Notes (Signed)
CSW received consult for hx of Anxiety and Depression.  CSW met with MOB to offer support and complete assessment.    When CSW arrived, MOB was dressed and was cleaning bottles.  Infant was asleep and throughout the assessment, MOB was attentive to infant's needs. MOB was easy to engage, polite, and receptive to meeting with CSW.    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 MOB reported MOB was dx with anxiety/depression in 2009 and is not currently taking any medication or have has a therapist.  MOB shared that in 2017 MOB experienced feelings of being overwhelmed and lashing out at love ones.  Per MOB, symptoms last for about 2 months and MOB was prescribed Wellbutrin.   MOB reported having a good support team and feeling prepared to parent.   CSW provided education regarding the baby blues period vs. perinatal mood disorders, discussed treatment and gave resources for mental health follow up if concerns arise.  CSW recommends self-evaluation during the postpartum time period using the New Mom Checklist from Postpartum Progress and encouraged MOB to contact a medical professional if symptoms are noted at any time.  CSW assessed for safety and MOB denied SI and HI.  MOB presented with insight and awareness about her MH and did not display any acute MH symptoms. MOB assured CSW that MOB will seek help if help is needed.    CSW identifies no further need for intervention and no barriers to discharge at this time.  Riverlyn Kizziah Boyd-Gilyard, MSW, LCSW Clinical Social Work [phone]   

## 2017-11-14 ENCOUNTER — Encounter (HOSPITAL_COMMUNITY): Payer: Self-pay

## 2021-10-20 ENCOUNTER — Ambulatory Visit: Payer: Managed Care, Other (non HMO) | Admitting: Allergy

## 2021-10-20 ENCOUNTER — Encounter: Payer: Self-pay | Admitting: Allergy

## 2021-10-20 VITALS — BP 110/60 | HR 94 | Resp 16 | Ht 65.5 in | Wt 206.2 lb

## 2021-10-20 DIAGNOSIS — L509 Urticaria, unspecified: Secondary | ICD-10-CM

## 2021-10-20 DIAGNOSIS — T50905D Adverse effect of unspecified drugs, medicaments and biological substances, subsequent encounter: Secondary | ICD-10-CM

## 2021-10-20 NOTE — Patient Instructions (Addendum)
Hives  - at this time etiology of hives and swelling is unknown.  Hives can be caused by a variety of different triggers including illness/infection, foods, medications, stings, exercise, pressure, vibrations, extremes of temperature to name a few however majority of the time there is no identifiable trigger.  Your symptoms have been ongoing for >6 weeks making this chronic thus will obtain labwork to evaluate: CBC w diff, CMP, tryptase, hive panel, environmental panel, alpha-gal panel - if hives return recommend taking antihistamine regimen of H1 blocker (Zyrtec, Allegra or Xyzal) 1 tab daily WITH H2 blocker (Pepcid) 1 tab daily.  If daily dosing is not enough to control hives then increase both medications to twice a day dosing.  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   Adverse medication effect - discussed today that NSAIDs like ibuprofen/advil/motrin/aleve are known histamine-releasing medications thus can cause hives and/or swelling as a side effect - recommend performing in-office graded challenge to Penicillin to see if you are not allergic anymore.  You have gone 10+ years since your initial issue with penicillins thus you are not likely to still be reactive at this time - continue current avoidance of allergic meds at this time until we address them in future with in-office challenges   Follow-up in 6-12 months or sooner if needed

## 2021-10-20 NOTE — Progress Notes (Signed)
New Patient Note  RE: Makynli Vasicek MRN: [id number] DOB: Dec 04, 1985 Date of Office Visit: 10/20/2021  Primary care provider:   Chief Complaint: hives  History of present illness: April Gamble is a 36 y.o. female presenting today for evaluation of hives.    She started having hives in April and states they popped up randomly.  She states there was nothing out of the ordinary when the hives appeared. Hives were itchy.  Denies new foods, medications or changes in body products.  No preceding illnesses.   No swelling.  No joint aches.  Hives lasted about a week before resolving fully.  Did not leave any marks or bruising behind.   She states she had Covid in Oct 2022 and did have a patchy rash at that time that looked different than the hive rash above.  She did go to [facility] for hive episode above in April 2023. She was noted on exam to have a "diffuse urticarial rash to torso and extremities". She was treated with kenalog injections as well as a prednisone 5 day burst which did help.  No history of asthma.  She was prescribed an albuterol inhaler when she had Covid illness however has not needed to use since.  No history of food allergy or eczema.  She may have seasonal allergy symptoms however states not bothersome enough to take any allergy based medications.   She has had hives before related to medications.  She states penicillin rash development occurred when she was a child.  The most recent medication event was with the erythromycin leading to hives but that too was 10 years ago if not longer from her recollection.   She does avoid penicillins, keflex, biaxin, erythemycin, sulfa drugs and aleve due to hive development.  She does report ibuprofen does not cause issue.   Review of systems in the past 4 weeks: Review of Systems  Constitutional: Negative.   HENT: Negative.    Eyes: Negative.   Respiratory: Negative.    Cardiovascular: Negative.   Gastrointestinal: Negative.   Musculoskeletal:  Negative.   Skin: Negative.   Allergic/Immunologic: Negative.   Neurological: Negative.     All other systems negative unless noted above in HPI  Past medical history: Past Medical History:  Diagnosis Date   Gestational diabetes    Gestational diabetes mellitus, antepartum    PCOS (polycystic ovarian syndrome)    Preterm labor    Urticaria     Past surgical history: Past Surgical History:  Procedure Laterality Date   WISDOM TOOTH EXTRACTION      Family history:  Family History  Problem Relation Age of Onset   Allergic rhinitis Mother    Hypertension Mother    Diabetes Father    Cancer Father    Hypertension Father    Stroke Maternal Grandfather    Heart disease Maternal Grandfather    Hypertension Maternal Grandfather    Kidney disease Maternal Grandfather    COPD Maternal Grandfather    Heart disease Paternal Grandfather     Social history: Lives in a home with carpeting in the bedroom with gas heating and central cooling.  Cat and hamster in the home.  Dogs, chickens, horse, cows, iguana and ducks outside the home.  No concern for water damage, mildew or roaches in the home.  She is a Child psychotherapist.  Denies smoking history.    Medication List: Current Outpatient Medications  Medication Sig Dispense Refill   Fluoxetine HCl, PMDD, 10 MG TABS fluoxetine  10 mg tablet     metFORMIN (GLUCOPHAGE) 500 MG tablet metformin 500 mg tablet  TAKE 1 TABLET BY MOUTH TWICE DAILY WITH MEALS     Semaglutide,0.25 or 0.5MG /DOS, (OZEMPIC, 0.25 OR 0.5 MG/DOSE,) 2 MG/3ML SOPN Ozempic 0.25 mg or 0.5 mg (2 mg/3 mL) subcutaneous pen injector     No current facility-administered medications for this visit.    Known medication allergies: Allergies  Allergen Reactions   Aleve [Naproxen Sodium] Hives    Patient states she takes ibuprofen with no problems   Amoxicillin Hives   Biaxin [Clarithromycin] Hives   Cephalexin Hives   Erythromycin Hives   Sulfa Antibiotics Hives      Physical examination: Blood pressure 110/60, pulse 94, resp. rate 16, height 5' 5.5" (1.664 m), weight 206 lb 3.2 oz (93.5 kg), SpO2 97 %, unknown if currently breastfeeding.  General: Alert, interactive, in no acute distress. HEENT: PERRLA, TMs pearly gray, turbinates non-edematous without discharge, post-pharynx non erythematous. Neck: Supple without lymphadenopathy. Lungs: Clear to auscultation without wheezing, rhonchi or rales. {no increased work of breathing. CV: Normal S1, S2 without murmurs. Abdomen: Nondistended, nontender. Skin: Warm and dry, without lesions or rashes. Extremities:  No clubbing, cyanosis or edema. Neuro:   Grossly intact.  Diagnositics/Labs:  Allergy testing: none today  Assessment and plan: Urticaria   - at this time etiology of hives and swelling is unknown.  Hives can be caused by a variety of different triggers including illness/infection, foods, medications, stings, exercise, pressure, vibrations, extremes of temperature to name a few however majority of the time there is no identifiable trigger.  Your symptoms have been ongoing for >6 weeks making this chronic thus will obtain labwork to evaluate: CBC w diff, CMP, tryptase, hive panel, environmental panel, alpha-gal panel - if hives return recommend taking antihistamine regimen of H1 blocker (Zyrtec, Allegra or Xyzal) 1 tab daily WITH H2 blocker (Pepcid) 1 tab daily.  If daily dosing is not enough to control hives then increase both medications to twice a day dosing.  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   Adverse medication effect - discussed today that NSAIDs like ibuprofen/advil/motrin/aleve are known histamine-releasing medications thus can cause hives and/or swelling as a side effect - recommend performing in-office graded challenge to Penicillin to see if you are not allergic anymore.  You have gone 10+ years since your  initial issue with penicillins thus you are not likely to still be reactive at this time - continue current avoidance of allergic meds at this time until we address them in future with in-office challenges  Follow-up in 6-12 months or sooner if needed  I appreciate the opportunity to take part in Valissa's care. Please do not hesitate to contact me with questions.  Sincerely,   Prudy Feeler, MD Allergy/Immunology [facility] [place] [facility] [place]

## 2021-10-23 LAB — COMPREHENSIVE METABOLIC PANEL
ALT: 20 IU/L (ref 0–32)
Albumin/Globulin Ratio: 1.8 (ref 1.2–2.2)
Alkaline Phosphatase: 110 IU/L (ref 44–121)
BUN/Creatinine Ratio: 10 (ref 9–23)
BUN: 8 mg/dL (ref 6–20)
CO2: 25 mmol/L (ref 20–29)
Creatinine, Ser: 0.77 mg/dL (ref 0.57–1.00)

## 2021-10-23 LAB — ALLERGENS W/TOTAL IGE AREA 2
Aspergillus Fumigatus IgE: <0.1 kU/L
Bermuda Grass IgE: <0.1 kU/L
Cedar, Mountain IgE: <0.1 kU/L
Cottonwood IgE: <0.1 kU/L
Elm, American IgE: <0.1 kU/L
Johnson Grass IgE: <0.1 kU/L
Maple/Box Elder IgE: <0.1 kU/L
Pecan, Hickory IgE: <0.1 kU/L
Pigweed, Rough IgE: <0.1 kU/L
White Mulberry IgE: <0.1 kU/L

## 2021-10-23 LAB — CBC WITH DIFFERENTIAL
Basos: 1 %
Eos: 1 %
Hematocrit: 43.9 % (ref 34.0–46.6)
Hemoglobin: 15.3 g/dL (ref 11.1–15.9)
Immature Grans (Abs): 0 10*3/uL (ref 0.0–0.1)
Lymphocytes Absolute: 2.8 10*3/uL (ref 0.7–3.1)
Lymphs: 27 %
MCHC: 34.9 g/dL (ref 31.5–35.7)
MCV: 84 fL (ref 79–97)
Neutrophils Absolute: 6.9 10*3/uL (ref 1.4–7.0)
RBC: 5.22 x10E6/uL (ref 3.77–5.28)
WBC: 10.4 10*3/uL (ref 3.4–10.8)

## 2021-10-23 LAB — ALPHA-GAL PANEL
Beef IgE: <0.1 kU/L
IgE (Immunoglobulin E), Serum: 55 IU/mL (ref 6–495)
Pork IgE: <0.1 kU/L

## 2021-10-31 LAB — THYROID ANTIBODIES
Thyroglobulin Antibody: <1 IU/mL (ref 0.0–0.9)
Thyroperoxidase Ab SerPl-aCnc: <9 IU/mL (ref 0–34)

## 2021-10-31 LAB — ALLERGENS W/TOTAL IGE AREA 2
Alternaria Alternata IgE: <0.1 kU/L
Cat Dander IgE: <0.1 kU/L
Cladosporium Herbarum IgE: <0.1 kU/L
Cockroach, German IgE: <0.1 kU/L
Common Silver Birch IgE: <0.1 kU/L
D Farinae IgE: 0.79 kU/L — AB
D Pteronyssinus IgE: 0.89 kU/L — AB
Dog Dander IgE: <0.1 kU/L
Mouse Urine IgE: <0.1 kU/L
Oak, White IgE: <0.1 kU/L
Penicillium Chrysogen IgE: <0.1 kU/L
Ragweed, Short IgE: <0.1 kU/L
Sheep Sorrel IgE Qn: <0.1 kU/L
Timothy Grass IgE: <0.1 kU/L

## 2021-10-31 LAB — CHRONIC URTICARIA: cu index: 1.9 (ref <10)

## 2021-10-31 LAB — ALPHA-GAL PANEL
Allergen Lamb IgE: <0.1 kU/L
O215-IgE Alpha-Gal: <0.1 kU/L

## 2021-10-31 LAB — COMPREHENSIVE METABOLIC PANEL
AST: 18 IU/L (ref 0–40)
Albumin: 4.4 g/dL (ref 3.8–4.8)
Bilirubin Total: <0.2 mg/dL (ref 0.0–1.2)
Calcium: 9.3 mg/dL (ref 8.7–10.2)
Chloride: 102 mmol/L (ref 96–106)
Globulin, Total: 2.5 g/dL (ref 1.5–4.5)
Glucose: 127 mg/dL — ABNORMAL HIGH (ref 70–99)
Potassium: 4.1 mmol/L (ref 3.5–5.2)
Sodium: 138 mmol/L (ref 134–144)
Total Protein: 6.9 g/dL (ref 6.0–8.5)
eGFR: 102 mL/min/{1.73_m2} (ref >59)

## 2021-10-31 LAB — CBC WITH DIFFERENTIAL
Basophils Absolute: 0.1 10*3/uL (ref 0.0–0.2)
EOS (ABSOLUTE): 0.1 10*3/uL (ref 0.0–0.4)
Immature Granulocytes: 0 %
MCH: 29.3 pg (ref 26.6–33.0)
Monocytes Absolute: 0.6 10*3/uL (ref 0.1–0.9)
Monocytes: 5 %
Neutrophils: 66 %
RDW: 13.9 % (ref 11.7–15.4)

## 2021-10-31 LAB — TRYPTASE: Tryptase: 2.3 ug/L (ref 2.2–13.2)

## 2021-11-05 NOTE — Progress Notes (Addendum)
COVID Vaccine Completed: yes x3  Date of COVID positive in last 90 days: no  PCP - Joaquin Music, NP Cardiologist - n/a  Chest x-ray - 03/13/21 [facility] EKG - 11/06/21 Epic/chart Stress Test - n/a ECHO - n/a Cardiac Cath - n/a Pacemaker/ICD device last checked: n/a Spinal Cord Stimulator: n/a  Bowel Prep - no  Sleep Study - n/a CPAP -   Fasting Blood Sugar - 110-130 Checks Blood Sugar 1 times a day  Blood Thinner Instructions: n/a Aspirin Instructions: Last Dose:  Activity level: Can go up a flight of stairs and perform activities of daily living without stopping and without symptoms of chest pain or shortness of breath.   Anesthesia review:   Patient denies shortness of breath, fever, cough and chest pain at PAT appointment  Patient verbalized understanding of instructions that were given to them at the PAT appointment. Patient was also instructed that they will need to review over the PAT instructions again at home before surgery.

## 2021-11-05 NOTE — Patient Instructions (Addendum)
DUE TO COVID-19 ONLY TWO VISITORS  (aged 37 and older)  ARE ALLOWED TO COME WITH YOU AND STAY IN THE WAITING ROOM ONLY DURING PRE OP AND PROCEDURE.   **NO VISITORS ARE ALLOWED IN THE SHORT STAY AREA OR RECOVERY ROOM!!**    Your procedure is scheduled on: 11/09/21   Report to [facility] Main Entrance    Report to admitting at 5:15 AM   Call this number if you have problems the morning of surgery [phone]   Do not eat food :After Midnight.   After Midnight you may have the following liquids until 4:30 AM DAY OF SURGERY  Water Black Coffee (sugar ok, NO MILK/CREAM OR CREAMERS)  Tea (sugar ok, NO MILK/CREAM OR CREAMERS) regular and decaf                             Plain Jell-O (NO RED)                                           Fruit ices (not with fruit pulp, NO RED)                                     Popsicles (NO RED)                                                                  Juice: apple, WHITE grape, WHITE cranberry Sports drinks like Gatorade (NO RED) Clear broth(vegetable,chicken,beef)  FOLLOW BOWEL PREP AND ANY ADDITIONAL PRE OP INSTRUCTIONS YOU RECEIVED FROM YOUR SURGEON'S OFFICE!!!     Oral Hygiene is also important to reduce your risk of infection.                                    Remember - BRUSH YOUR TEETH THE MORNING OF SURGERY WITH YOUR REGULAR TOOTHPASTE   Take these medicines the morning of surgery with A SIP OF WATER: Tylenol, Fluoxetine  DO NOT TAKE ANY ORAL DIABETIC MEDICATIONS DAY OF YOUR SURGERY  How to Manage Your Diabetes Before and After Surgery  Why is it important to control my blood sugar before and after surgery? Improving blood sugar levels before and after surgery helps healing and can limit problems. A way of improving blood sugar control is eating a healthy diet by:  Eating less sugar and carbohydrates  Increasing activity/exercise  Talking with your doctor about reaching your blood sugar goals High blood sugars (greater  than 180 mg/dL) can raise your risk of infections and slow your recovery, so you will need to focus on controlling your diabetes during the weeks before surgery. Make sure that the doctor who takes care of your diabetes knows about your planned surgery including the date and location.  How do I manage my blood sugar before surgery? Check your blood sugar at least 4 times a day, starting 2 days before surgery, to make sure that the level is not too high or low. Check  your blood sugar the morning of your surgery when you wake up and every 2 hours until you get to the Short Stay unit. If your blood sugar is less than 70 mg/dL, you will need to treat for low blood sugar: Do not take insulin. Treat a low blood sugar (less than 70 mg/dL) with  cup of clear juice (cranberry or apple), 4 glucose tablets, OR glucose gel. Recheck blood sugar in 15 minutes after treatment (to make sure it is greater than 70 mg/dL). If your blood sugar is not greater than 70 mg/dL on recheck, call [phone] for further instructions. Report your blood sugar to the short stay nurse when you get to Short Stay.  If you are admitted to the hospital after surgery: Your blood sugar will be checked by the staff and you will probably be given insulin after surgery (instead of oral diabetes medicines) to make sure you have good blood sugar levels. The goal for blood sugar control after surgery is 80-180 mg/dL.   WHAT DO I DO ABOUT MY DIABETES MEDICATION?  Do not take oral diabetes medicines (pills) the morning of surgery.  THE DAY BEFORE SURGERY, take Metformin as prescribed.       THE MORNING OF SURGERY, do not take Metformin  Reviewed and Endorsed by [facility] Patient Education Committee, August 2015                               You may not have any metal on your body including hair pins, jewelry, and body piercing             Do not wear make-up, lotions, powders, perfumes, or deodorant  Do not wear nail polish  including gel and S&S, artificial/acrylic nails, or any other type of covering on natural nails including finger and toenails. If you have artificial nails, gel coating, etc. that needs to be removed by a nail salon please have this removed prior to surgery or surgery may need to be canceled/ delayed if the surgeon/ anesthesia feels like they are unable to be safely monitored.   Do not shave  48 hours prior to surgery.    Do not bring valuables to the hospital. [facility] IS NOT             RESPONSIBLE   FOR VALUABLES.  DO NOT BRING YOUR HOME MEDICATIONS TO THE HOSPITAL. PHARMACY WILL DISPENSE MEDICATIONS LISTED ON YOUR MEDICATION LIST TO YOU DURING YOUR ADMISSION IN THE HOSPITAL!    Patients discharged on the day of surgery will not be allowed to drive home.  Someone NEEDS to stay with you for the first 24 hours after anesthesia.              Please read over the following fact sheets you were given: IF YOU HAVE QUESTIONS ABOUT YOUR PRE-OP INSTRUCTIONS PLEASE CALL [phone]- [facility] - Preparing for Surgery Before surgery, you can play an important role.  Because skin is not sterile, your skin needs to be as free of germs as possible.  You can reduce the number of germs on your skin by washing with CHG (chlorahexidine gluconate) soap before surgery.  CHG is an antiseptic cleaner which kills germs and bonds with the skin to continue killing germs even after washing. Please DO NOT use if you have an allergy to CHG or antibacterial soaps.  If your skin becomes reddened/irritated stop using  the CHG and inform your nurse when you arrive at Short Stay. Do not shave (including legs and underarms) for at least 48 hours prior to the first CHG shower.  You may shave your face/neck.  Please follow these instructions carefully:  1.  Shower with CHG Soap the night before surgery and the  morning of surgery.  2.  If you choose to wash your hair, wash your hair first as usual with your  normal  shampoo.  3.  After you shampoo, rinse your hair and body thoroughly to remove the shampoo.                             4.  Use CHG as you would any other liquid soap.  You can apply chg directly to the skin and wash.  Gently with a scrungie or clean washcloth.  5.  Apply the CHG Soap to your body ONLY FROM THE NECK DOWN.   Do   not use on face/ open                           Wound or open sores. Avoid contact with eyes, ears mouth and   genitals (private parts).                       Wash face,  Genitals (private parts) with your normal soap.             6.  Wash thoroughly, paying special attention to the area where your    surgery  will be performed.  7.  Thoroughly rinse your body with warm water from the neck down.  8.  DO NOT shower/wash with your normal soap after using and rinsing off the CHG Soap.                9.  Pat yourself dry with a clean towel.            10.  Wear clean pajamas.            11.  Place clean sheets on your bed the night of your first shower and do not  sleep with pets. Day of Surgery : Do not apply any lotions/deodorants the morning of surgery.  Please wear clean clothes to the hospital/surgery center.  FAILURE TO FOLLOW THESE INSTRUCTIONS MAY RESULT IN THE CANCELLATION OF YOUR SURGERY  PATIENT SIGNATURE_________________________________  NURSE SIGNATURE__________________________________  ________________________________________________________________________  WHAT IS A BLOOD TRANSFUSION? Blood Transfusion Information  A transfusion is the replacement of blood or some of its parts. Blood is made up of multiple cells which provide different functions. Red blood cells carry oxygen and are used for blood loss replacement. White blood cells fight against infection. Platelets control bleeding. Plasma helps clot blood. Other blood products are available for specialized needs, such as hemophilia or other clotting disorders. BEFORE THE TRANSFUSION  Who  gives blood for transfusions?  Healthy volunteers who are fully evaluated to make sure their blood is safe. This is blood bank blood. Transfusion therapy is the safest it has ever been in the practice of medicine. Before blood is taken from a donor, a complete history is taken to make sure that person has no history of diseases nor engages in risky social behavior (examples are intravenous drug use or sexual activity with multiple partners). The donor's travel history is screened to minimize risk of transmitting  infections, such as malaria. The donated blood is tested for signs of infectious diseases, such as HIV and hepatitis. The blood is then tested to be sure it is compatible with you in order to minimize the chance of a transfusion reaction. If you or a relative donates blood, this is often done in anticipation of surgery and is not appropriate for emergency situations. It takes many days to process the donated blood. RISKS AND COMPLICATIONS Although transfusion therapy is very safe and saves many lives, the main dangers of transfusion include:  Getting an infectious disease. Developing a transfusion reaction. This is an allergic reaction to something in the blood you were given. Every precaution is taken to prevent this. The decision to have a blood transfusion has been considered carefully by your caregiver before blood is given. Blood is not given unless the benefits outweigh the risks. AFTER THE TRANSFUSION Right after receiving a blood transfusion, you will usually feel much better and more energetic. This is especially true if your red blood cells have gotten low (anemic). The transfusion raises the level of the red blood cells which carry oxygen, and this usually causes an energy increase. The nurse administering the transfusion will monitor you carefully for complications. HOME CARE INSTRUCTIONS  No special instructions are needed after a transfusion. You may find your energy is better.  Speak with your caregiver about any limitations on activity for underlying diseases you may have. SEEK MEDICAL CARE IF:  Your condition is not improving after your transfusion. You develop redness or irritation at the intravenous (IV) site. SEEK IMMEDIATE MEDICAL CARE IF:  Any of the following symptoms occur over the next 12 hours: Shaking chills. You have a temperature by mouth above 102 F (38.9 C), not controlled by medicine. Chest, back, or muscle pain. People around you feel you are not acting correctly or are confused. Shortness of breath or difficulty breathing. Dizziness and fainting. You get a rash or develop hives. You have a decrease in urine output. Your urine turns a dark color or changes to pink, red, or brown. Any of the following symptoms occur over the next 10 days: You have a temperature by mouth above 102 F (38.9 C), not controlled by medicine. Shortness of breath. Weakness after normal activity. The white part of the eye turns yellow (jaundice). You have a decrease in the amount of urine or are urinating less often. Your urine turns a dark color or changes to pink, red, or brown. Document Released: 04/23/2000 Document Revised: 07/19/2011 Document Reviewed: 12/11/2007 [facility] Patient Information 2014 [place], [place].  _______________________________________________________________________

## 2021-11-06 ENCOUNTER — Encounter (HOSPITAL_COMMUNITY)
Admission: RE | Admit: 2021-11-06 | Discharge: 2021-11-06 | Disposition: A | Discharge status: Home / Self Care | Payer: Managed Care, Other (non HMO) | Source: Ambulatory Visit | Attending: Obstetrics & Gynecology | Admitting: Obstetrics & Gynecology

## 2021-11-06 ENCOUNTER — Encounter (HOSPITAL_COMMUNITY): Payer: Self-pay

## 2021-11-06 VITALS — BP 113/76 | HR 85 | Temp 98.5°F | Resp 14 | Ht 66.0 in | Wt 201.2 lb

## 2021-11-06 DIAGNOSIS — Z01818 Encounter for other preprocedural examination: Secondary | ICD-10-CM | POA: Diagnosis present

## 2021-11-06 DIAGNOSIS — E119 Type 2 diabetes mellitus without complications: Secondary | ICD-10-CM | POA: Diagnosis not present

## 2021-11-06 DIAGNOSIS — N979 Female infertility, unspecified: Secondary | ICD-10-CM

## 2021-11-06 HISTORY — DX: Depression, unspecified: F32.A

## 2021-11-06 HISTORY — DX: Headache, unspecified: R51.9

## 2021-11-06 HISTORY — DX: Anxiety disorder, unspecified: F41.9

## 2021-11-06 HISTORY — DX: Gastro-esophageal reflux disease without esophagitis: K21.9

## 2021-11-06 LAB — PREGNANCY, URINE: Preg Test, Ur: NEGATIVE

## 2021-11-06 LAB — GLUCOSE, CAPILLARY: Glucose-Capillary: 145 mg/dL — ABNORMAL HIGH (ref 70–99)

## 2021-11-06 NOTE — H&P (Signed)
April Gamble is an 36 y.o. female [phone] who desires sterility.    Pertinent Gynecological History: Menses: flow is light Bleeding: light spotting with IUD Contraception: IUD DES exposure: unknown Blood transfusions: none Sexually transmitted diseases: no past history Previous GYN Procedures:  LEEP   Last mammogram:  n/a  Date: n/a Last pap: normal Date: 09/25/21 OB History: G4, P1122   Menstrual History: Menarche age: n/a No LMP recorded.    Past Medical History:  Diagnosis Date   Anxiety    Depression    GERD (gastroesophageal reflux disease)    Gestational diabetes    Gestational diabetes mellitus, antepartum    Headache    PCOS (polycystic ovarian syndrome)    Preterm labor    Urticaria     Past Surgical History:  Procedure Laterality Date   WISDOM TOOTH EXTRACTION      Family History  Problem Relation Age of Onset   Allergic rhinitis Mother    Hypertension Mother    Diabetes Father    Cancer Father    Hypertension Father    Stroke Maternal Grandfather    Heart disease Maternal Grandfather    Hypertension Maternal Grandfather    Kidney disease Maternal Grandfather    COPD Maternal Grandfather    Heart disease Paternal Grandfather     Social History:  reports that she has never smoked. She has never used smokeless tobacco. She reports that she does not drink alcohol and does not use drugs.  Allergies:  Allergies  Allergen Reactions   Aleve [Naproxen Sodium] Hives    Patient states she takes ibuprofen with no problems   Amoxicillin Hives   Biaxin [Clarithromycin] Hives   Cephalexin Hives   Erythromycin Hives   Sulfa Antibiotics Hives    No medications prior to admission.    Review of Systems  unknown if currently breastfeeding. Physical Exam Constitutional:      Appearance: Normal appearance.  HENT:     Head: Normocephalic and atraumatic.  Pulmonary:     Effort: Pulmonary effort is normal.  Abdominal:     Palpations: Abdomen is soft.   Musculoskeletal:        General: Normal range of motion.     Cervical back: Normal range of motion.  Skin:    General: Skin is warm and dry.  Neurological:     Mental Status: She is alert and oriented to person, place, and time.  Psychiatric:        Mood and Affect: Mood normal.        Behavior: Behavior normal.     Results for orders placed or performed during the hospital encounter of 11/06/21 (from the past 24 hour(s))  Glucose, capillary     Status: Abnormal   Collection Time: 11/06/21  8:23 AM  Result Value Ref Range   Glucose-Capillary 145 (H) 70 - 99 mg/dL  Type and screen [facility]     Status: None   Collection Time: 11/06/21  8:40 AM  Result Value Ref Range   ABO/RH(D) A POS    Antibody Screen NEG    Sample Expiration 11/20/2021,2359    Extend sample reason      NO TRANSFUSIONS OR PREGNANCY IN THE PAST 3 MONTHS Performed at [facility], [address], [place], [place] [address]   Pregnancy, urine     Status: None   Collection Time: 11/06/21  8:40 AM  Result Value Ref Range   Preg Test, Ur NEGATIVE NEGATIVE  No results found.  Assessment/Plan: 27BT [id number] with desire for sterility -L/S BTL and removal of IUD -Patient is counseled re: risk of bleeding, infection, scarring and damage to surrounding structures.  She is counseled re: need to use contraception until 6 mo postop.  All questions were answered and we will proceed.  Mitchel Honour 11/06/2021, 3:54 PM

## 2021-11-06 NOTE — Anesthesia Preprocedure Evaluation (Addendum)
Anesthesia Evaluation  Patient identified by MRN, date of birth, ID band Patient awake    Reviewed: Allergy & Precautions, NPO status , Patient's Chart, lab work & pertinent test results  Airway Mallampati: I  TM Distance: >3 FB Neck ROM: Full    Dental no notable dental hx. (+) Teeth Intact, Dental Advisory Given   Pulmonary neg pulmonary ROS,    Pulmonary exam normal breath sounds clear to auscultation       Cardiovascular negative cardio ROS Normal cardiovascular exam Rhythm:Regular Rate:Normal     Neuro/Psych  Headaches, PSYCHIATRIC DISORDERS Anxiety Depression    GI/Hepatic Neg liver ROS, GERD  ,  Endo/Other  diabetes, Well Controlled, Type 2, Oral Hypoglycemic AgentsBMI 34 FS 120 this AM  Renal/GU negative Renal ROS  Female GU complaint     Musculoskeletal negative musculoskeletal ROS (+)   Abdominal (+) + obese,   Peds  Hematology negative hematology ROS (+)   Anesthesia Other Findings   Reproductive/Obstetrics Desires sterility                            Anesthesia Physical Anesthesia Plan  ASA: 2  Anesthesia Plan: General   Post-op Pain Management: Tylenol PO (pre-op)*   Induction: Intravenous  PONV Risk Score and Plan: 4 or greater and Ondansetron, Dexamethasone, Midazolam, Treatment may vary due to age or medical condition and Scopolamine patch - Pre-op  Airway Management Planned: Oral ETT  Additional Equipment: None  Intra-op Plan:   Post-operative Plan: Extubation in OR  Informed Consent: I have reviewed the patients History and Physical, chart, labs and discussed the procedure including the risks, benefits and alternatives for the proposed anesthesia with the patient or authorized representative who has indicated his/her understanding and acceptance.     Dental advisory given  Plan Discussed with: CRNA  Anesthesia Plan Comments:        Anesthesia  Quick Evaluation

## 2021-11-07 LAB — HEMOGLOBIN A1C
Hgb A1c MFr Bld: 6.2 % — ABNORMAL HIGH (ref 4.8–5.6)
Mean Plasma Glucose: 131 mg/dL

## 2021-11-09 ENCOUNTER — Encounter (HOSPITAL_COMMUNITY)
Admission: RE | Disposition: A | Discharge status: Home / Self Care | Payer: Self-pay | Source: Ambulatory Visit | Attending: Obstetrics & Gynecology

## 2021-11-09 ENCOUNTER — Ambulatory Visit (HOSPITAL_COMMUNITY)
Admission: RE | Admit: 2021-11-09 | Discharge: 2021-11-09 | Disposition: A | Discharge status: Home / Self Care | Payer: Managed Care, Other (non HMO) | Source: Ambulatory Visit | Attending: Obstetrics & Gynecology | Admitting: Obstetrics & Gynecology

## 2021-11-09 ENCOUNTER — Other Ambulatory Visit: Payer: Self-pay

## 2021-11-09 ENCOUNTER — Encounter (HOSPITAL_COMMUNITY): Payer: Self-pay | Admitting: Obstetrics & Gynecology

## 2021-11-09 ENCOUNTER — Ambulatory Visit (HOSPITAL_BASED_OUTPATIENT_CLINIC_OR_DEPARTMENT_OTHER): Payer: Managed Care, Other (non HMO) | Admitting: Anesthesiology

## 2021-11-09 ENCOUNTER — Ambulatory Visit (HOSPITAL_COMMUNITY): Payer: Managed Care, Other (non HMO) | Admitting: Anesthesiology

## 2021-11-09 DIAGNOSIS — Z30432 Encounter for removal of intrauterine contraceptive device: Secondary | ICD-10-CM | POA: Diagnosis not present

## 2021-11-09 DIAGNOSIS — E669 Obesity, unspecified: Secondary | ICD-10-CM | POA: Insufficient documentation

## 2021-11-09 DIAGNOSIS — Z7984 Long term (current) use of oral hypoglycemic drugs: Secondary | ICD-10-CM | POA: Diagnosis not present

## 2021-11-09 DIAGNOSIS — E119 Type 2 diabetes mellitus without complications: Secondary | ICD-10-CM | POA: Diagnosis not present

## 2021-11-09 DIAGNOSIS — Z6834 Body mass index (BMI) 34.0-34.9, adult: Secondary | ICD-10-CM | POA: Diagnosis not present

## 2021-11-09 DIAGNOSIS — Z302 Encounter for sterilization: Secondary | ICD-10-CM | POA: Diagnosis not present

## 2021-11-09 DIAGNOSIS — N979 Female infertility, unspecified: Secondary | ICD-10-CM

## 2021-11-09 HISTORY — PX: IUD REMOVAL: SHX5392

## 2021-11-09 HISTORY — PX: LAPAROSCOPIC TUBAL LIGATION: SHX1937

## 2021-11-09 LAB — CBC
HCT: 43.3 % (ref 36.0–46.0)
Hemoglobin: 14.5 g/dL (ref 12.0–15.0)
MCH: 29.3 pg (ref 26.0–34.0)
MCHC: 33.5 g/dL (ref 30.0–36.0)
MCV: 87.5 fL (ref 80.0–100.0)
Platelets: 374 10*3/uL (ref 150–400)
RBC: 4.95 MIL/uL (ref 3.87–5.11)
RDW: 13.5 % (ref 11.5–15.5)
WBC: 8.5 10*3/uL (ref 4.0–10.5)
nRBC: 0 % (ref 0.0–0.2)

## 2021-11-09 LAB — GLUCOSE, CAPILLARY
Glucose-Capillary: 124 mg/dL — ABNORMAL HIGH (ref 70–99)
Glucose-Capillary: 113 mg/dL — ABNORMAL HIGH (ref 70–99)

## 2021-11-09 LAB — TYPE AND SCREEN
ABO/RH(D): A POS
Antibody Screen: NEGATIVE

## 2021-11-09 LAB — PREGNANCY, URINE: Preg Test, Ur: NEGATIVE

## 2021-11-09 SURGERY — LIGATION, FALLOPIAN TUBE, LAPAROSCOPIC
Anesthesia: General | Site: Vagina

## 2021-11-09 MED ORDER — LACTATED RINGERS IV SOLN: INTRAVENOUS | Status: DC

## 2021-11-09 MED ORDER — LIDOCAINE HCL (CARDIAC) PF 100 MG/5ML IV SOSY
PREFILLED_SYRINGE | INTRAVENOUS | Status: DC | PRN
  Administered 2021-11-09: 60 mg via INTRAVENOUS

## 2021-11-09 MED ORDER — PROPOFOL 10 MG/ML IV BOLUS
INTRAVENOUS | Status: DC | PRN
  Administered 2021-11-09: 200 mg via INTRAVENOUS

## 2021-11-09 MED ORDER — LIDOCAINE HCL (PF) 2 % IJ SOLN
INTRAMUSCULAR | Status: AC
Start: 2021-11-09 — End: ?
  Filled 2021-11-09: qty 5

## 2021-11-09 MED ORDER — PHENYLEPHRINE HCL (PRESSORS) 10 MG/ML IV SOLN
INTRAVENOUS | Status: DC | PRN
  Administered 2021-11-09: 80 ug via INTRAVENOUS

## 2021-11-09 MED ORDER — FENTANYL CITRATE (PF) 100 MCG/2ML IJ SOLN
INTRAMUSCULAR | Status: DC | PRN
  Administered 2021-11-09 (×2): 50 ug via INTRAVENOUS
  Administered 2021-11-09: 100 ug via INTRAVENOUS

## 2021-11-09 MED ORDER — MIDAZOLAM HCL 2 MG/2ML IJ SOLN
INTRAMUSCULAR | Status: AC
  Filled 2021-11-09: qty 2

## 2021-11-09 MED ORDER — ROCURONIUM BROMIDE 100 MG/10ML IV SOLN
INTRAVENOUS | Status: DC | PRN
  Administered 2021-11-09: 80 mg via INTRAVENOUS

## 2021-11-09 MED ORDER — PHENYLEPHRINE 80 MCG/ML (10ML) SYRINGE FOR IV PUSH (FOR BLOOD PRESSURE SUPPORT)
PREFILLED_SYRINGE | INTRAVENOUS | Status: AC
Start: 2021-11-09 — End: ?
  Filled 2021-11-09: qty 10

## 2021-11-09 MED ORDER — SCOPOLAMINE 1 MG/3DAYS TD PT72
1.0000 | MEDICATED_PATCH | TRANSDERMAL | Status: DC
  Administered 2021-11-09: 1.5 mg via TRANSDERMAL
  Filled 2021-11-09: qty 1

## 2021-11-09 MED ORDER — OXYCODONE-ACETAMINOPHEN 5-325 MG PO TABS: 1.0000 | ORAL_TABLET | ORAL | 0 refills | Status: AC | PRN

## 2021-11-09 MED ORDER — HYDROMORPHONE HCL 1 MG/ML IJ SOLN: 0.2500 mg | INTRAMUSCULAR | Status: DC | PRN

## 2021-11-09 MED ORDER — OXYCODONE HCL 5 MG PO TABS: 5.0000 mg | ORAL_TABLET | Freq: Once | ORAL | Status: DC | PRN

## 2021-11-09 MED ORDER — BUPIVACAINE HCL (PF) 0.25 % IJ SOLN
INTRAMUSCULAR | Status: AC
Start: 2021-11-09 — End: ?
  Filled 2021-11-09: qty 30

## 2021-11-09 MED ORDER — ACETAMINOPHEN 500 MG PO TABS
1000.0000 mg | ORAL_TABLET | Freq: Once | ORAL | Status: AC
  Administered 2021-11-09: 1000 mg via ORAL
  Filled 2021-11-09: qty 2

## 2021-11-09 MED ORDER — FENTANYL CITRATE (PF) 100 MCG/2ML IJ SOLN
INTRAMUSCULAR | Status: AC
  Filled 2021-11-09: qty 2

## 2021-11-09 MED ORDER — DEXAMETHASONE SODIUM PHOSPHATE 10 MG/ML IJ SOLN
INTRAMUSCULAR | Status: AC
  Filled 2021-11-09: qty 1

## 2021-11-09 MED ORDER — SUGAMMADEX SODIUM 200 MG/2ML IV SOLN
INTRAVENOUS | Status: DC | PRN
  Administered 2021-11-09: 200 mg via INTRAVENOUS

## 2021-11-09 MED ORDER — PROPOFOL 10 MG/ML IV BOLUS
INTRAVENOUS | Status: AC
  Filled 2021-11-09: qty 20

## 2021-11-09 MED ORDER — OXYCODONE HCL 5 MG/5ML PO SOLN: 5.0000 mg | Freq: Once | ORAL | Status: DC | PRN

## 2021-11-09 MED ORDER — MEPERIDINE HCL 50 MG/ML IJ SOLN: 6.2500 mg | INTRAMUSCULAR | Status: DC | PRN

## 2021-11-09 MED ORDER — ORAL CARE MOUTH RINSE: 15.0000 mL | Freq: Once | OROMUCOSAL | Status: AC

## 2021-11-09 MED ORDER — AMISULPRIDE (ANTIEMETIC) 5 MG/2ML IV SOLN: 10.0000 mg | Freq: Once | INTRAVENOUS | Status: DC | PRN

## 2021-11-09 MED ORDER — SILVER NITRATE-POT NITRATE 75-25 % EX MISC
CUTANEOUS | Status: AC
  Filled 2021-11-09: qty 10

## 2021-11-09 MED ORDER — MIDAZOLAM HCL 5 MG/5ML IJ SOLN
INTRAMUSCULAR | Status: DC | PRN
  Administered 2021-11-09: 2 mg via INTRAVENOUS

## 2021-11-09 MED ORDER — POVIDONE-IODINE 10 % EX SWAB: 2.0000 | Freq: Once | CUTANEOUS | Status: DC

## 2021-11-09 MED ORDER — ONDANSETRON HCL 4 MG/2ML IJ SOLN
INTRAMUSCULAR | Status: DC | PRN
  Administered 2021-11-09: 4 mg via INTRAVENOUS

## 2021-11-09 MED ORDER — ONDANSETRON HCL 4 MG/2ML IJ SOLN
INTRAMUSCULAR | Status: AC
  Filled 2021-11-09: qty 2

## 2021-11-09 MED ORDER — DEXAMETHASONE SODIUM PHOSPHATE 4 MG/ML IJ SOLN
INTRAMUSCULAR | Status: DC | PRN
  Administered 2021-11-09: 5 mg via INTRAVENOUS

## 2021-11-09 MED ORDER — LIDOCAINE HCL (PF) 2 % IJ SOLN
INTRAMUSCULAR | Status: AC
  Filled 2021-11-09: qty 5

## 2021-11-09 MED ORDER — ONDANSETRON HCL 4 MG/2ML IJ SOLN: 4.0000 mg | Freq: Once | INTRAMUSCULAR | Status: DC | PRN

## 2021-11-09 MED ORDER — KETOROLAC TROMETHAMINE 30 MG/ML IJ SOLN: 30.0000 mg | Freq: Once | INTRAMUSCULAR | Status: DC | PRN

## 2021-11-09 MED ORDER — CHLORHEXIDINE GLUCONATE 0.12 % MT SOLN
15.0000 mL | Freq: Once | OROMUCOSAL | Status: AC
  Administered 2021-11-09: 15 mL via OROMUCOSAL

## 2021-11-09 MED ORDER — SODIUM CHLORIDE 0.9 % IR SOLN
Status: DC | PRN
  Administered 2021-11-09: 1000 mL

## 2021-11-09 MED ORDER — BUPIVACAINE HCL (PF) 0.25 % IJ SOLN
INTRAMUSCULAR | Status: DC | PRN
  Administered 2021-11-09: 7 mL

## 2021-11-09 MED ORDER — ONDANSETRON HCL 4 MG/2ML IJ SOLN
INTRAMUSCULAR | Status: AC
Start: 2021-11-09 — End: ?
  Filled 2021-11-09: qty 2

## 2021-11-09 SURGICAL SUPPLY — 33 items
APPLICATOR COTTON TIP 6 STRL (MISCELLANEOUS) IMPLANT
APPLICATOR COTTON TIP 6IN STRL (MISCELLANEOUS) ×6
COVER MAYO STAND STRL (DRAPES) ×3 IMPLANT
DERMABOND ADVANCED (GAUZE/BANDAGES/DRESSINGS) ×1
DERMABOND ADVANCED .7 DNX12 (GAUZE/BANDAGES/DRESSINGS) IMPLANT
DURAPREP 26ML APPLICATOR (WOUND CARE) ×3 IMPLANT
FORCEPS CUTTING 45CM 5MM (CUTTING FORCEPS) ×1 IMPLANT
GLOVE BIO SURGEON STRL SZ 6 (GLOVE) ×3 IMPLANT
GLOVE BIOGEL PI IND STRL 6 (GLOVE) ×4 IMPLANT
GLOVE BIOGEL PI IND STRL 7.0 (GLOVE) IMPLANT
GLOVE BIOGEL PI INDICATOR 6 (GLOVE) ×2
GLOVE BIOGEL PI INDICATOR 7.0 (GLOVE) ×3
GLOVE ECLIPSE 6.0 STRL STRAW (GLOVE) ×3 IMPLANT
GLOVE SS PI  5.5 STRL (GLOVE) ×1
GLOVE SS PI 5.5 STRL (GLOVE) ×2 IMPLANT
GOWN STRL REUS W/ TWL LRG LVL3 (GOWN DISPOSABLE) ×6 IMPLANT
GOWN STRL REUS W/TWL LRG LVL3 (GOWN DISPOSABLE) ×3
GOWN STRL REUS W/TWL XL LVL3 (GOWN DISPOSABLE) ×1 IMPLANT
KIT TURNOVER KIT A (KITS) ×3 IMPLANT
NDL INSUFFLATION 14GA 120MM (NEEDLE) ×2 IMPLANT
NEEDLE INSUFFLATION 14GA 120MM (NEEDLE) ×3 IMPLANT
NS IRRIG 1000ML POUR BTL (IV SOLUTION) ×3 IMPLANT
PACK LAPAROSCOPY BASIN (CUSTOM PROCEDURE TRAY) ×3 IMPLANT
PACK TRENDGUARD 450 HYBRID PRO (MISCELLANEOUS) IMPLANT
PAD OB MATERNITY 4.3X12.25 (PERSONAL CARE ITEMS) ×3 IMPLANT
SET TUBE SMOKE EVAC HIGH FLOW (TUBING) ×3 IMPLANT
SUT MNCRL AB 3-0 PS2 18 (SUTURE) ×3 IMPLANT
SUT VICRYL 0 UR6 27IN ABS (SUTURE) ×3 IMPLANT
TOWEL OR 17X26 10 PK STRL BLUE (TOWEL DISPOSABLE) ×3 IMPLANT
TRENDGUARD 450 HYBRID PRO PACK (MISCELLANEOUS) ×3
TROCAR 11X100 Z THREAD (TROCAR) ×3 IMPLANT
TROCAR BLADELESS OPT 5 100 (ENDOMECHANICALS) ×1 IMPLANT
WARMER LAPAROSCOPE (MISCELLANEOUS) ×3 IMPLANT

## 2021-11-09 NOTE — Anesthesia Procedure Notes (Signed)
Procedure Name: Intubation Date/Time: 11/09/2021 7:36 AM  Performed by: Justice Rocher, CRNAPre-anesthesia Checklist: Patient identified, Emergency Drugs available, Suction available, Patient being monitored and Timeout performed Patient Re-evaluated:Patient Re-evaluated prior to induction Oxygen Delivery Method: Circle system utilized Preoxygenation: Pre-oxygenation with 100% oxygen Induction Type: IV induction Ventilation: Mask ventilation without difficulty Laryngoscope Size: Mac and 4 Grade View: Grade II Tube type: Oral Tube size: 7.0 mm Number of attempts: 1 Airway Equipment and Method: Stylet and Oral airway Placement Confirmation: ETT inserted through vocal cords under direct vision, positive ETCO2, breath sounds checked- equal and bilateral and CO2 detector Secured at: 22 cm Tube secured with: Tape Dental Injury: Teeth and Oropharynx as per pre-operative assessment

## 2021-11-09 NOTE — Discharge Instructions (Signed)
Call MD for T>100.4, heavy vaginal bleeding, severe abdominal pain, intractable nausea and/or vomiting or respiratory distress.  Call office to schedule postop appointment in 2 weeks.  Pelvic rest x 6 weeks.  No driving while taking narcotics.

## 2021-11-09 NOTE — Op Note (Signed)
PREOPERATIVE DIAGNOSES: 1. Desires permanent sterilization   POSTOPERATIVE DIAGNOSES: 1. Same   PROCEDURE PERFORMED: Bilateral tubal ligation via cautery, Mirena removal   SURGEON: Dr. Mitchel Honour ASSISTANT: None   ANESTHESIA: General    ESTIMATED BLOOD LOSS: 10cc.   COMPLICATIONS: None   FINDINGS: On exam, under anesthesia, normal appearing vulva and vagina, a normal sized uterus    Operative findings demonstrated a  normal uterus. Normal fallopian tubes and ovaries bilaterally.     Procedure: A general anesthesia was induced and the patient was placed in the dirsal lithotomy position. The abdomen, perineum, and vagina were prepped and draped in the usual fashion. The bladder was drained. After the initial preparation, the procedure commenced at the vagina. With a speculum in place to visualize the cervix, IUD was easily removed.  The cervix was grasped and acorn manipulator was placed within the uterine cavity for manipulation purposes being careful not to puncture the uterus. Attention was then turned to the abdomen.    An infraumbilical incision was made and the Veress needle was gently advanced taking care to feel for the typical sensation of penetrating the peritoneum. With CO2 infiltration, an opening pressure of was noted, and following this, a pneumoperitoneum of 15 mmHg was created. A 11 mm trocar was then passed through the same incision and the laparoscope was then inserted through the trocar sleeve.  Visualization of the peritoneal cavity was then obtained and inspection did not reveal any signs of complications from entry. Once the placement of the port was complete, the actual laparoscopic procedure began. Above operative findings noted.  A 5 mm skin incision was made in the suprapubic region.  5 mm trocar was advanced under direct visualization.  Once confirmation of right fallopian tube done and atraumatic grasper was used to elevate, the Gyrus was used to cauterize  3-4 cm of the isthmic portion of the fallopian tube.  The tube was thoroughly inspected and the tubal segment was thoroughly coagulated. The same thing was done on the left fallopian tube. Hemostasis was noted and a complete inspection confirmed appropriate findings.    All gas removed from abdomen and all instruments were removed. Fascia was closed with 0' vicryl and skin closed with 4'0 monocryl. The sponge and lap counts were correct times 2 at this time.  Jacobs tenaculum was removed.    The patient's procedure was terminated. We then awakened her. She was sent to the Recovery Room in good condition.

## 2021-11-09 NOTE — Anesthesia Postprocedure Evaluation (Signed)
Anesthesia Post Note  Patient: April Gamble  Procedure(s) Performed: LAPAROSCOPIC BILATERAL TUBAL LIGATION WITH CAUTERY (Bilateral: Abdomen) INTRAUTERINE DEVICE (IUD) REMOVAL (Vagina )     Patient location during evaluation: PACU Anesthesia Type: General Level of consciousness: awake and alert, oriented and patient cooperative Pain management: pain level controlled Vital Signs Assessment: post-procedure vital signs reviewed and stable Respiratory status: spontaneous breathing, nonlabored ventilation and respiratory function stable Cardiovascular status: blood pressure returned to baseline and stable Postop Assessment: no apparent nausea or vomiting Anesthetic complications: no   No notable events documented.  Last Vitals:  Vitals:   11/09/21 0840 11/09/21 0900  BP: 107/64 105/80  Pulse: 91 72  Resp: 12 11  Temp: 37 C   SpO2: 100% 98%    Last Pain:  Vitals:   11/09/21 0900  TempSrc:   PainSc: 0-No pain                 Lannie Fields

## 2021-11-09 NOTE — Transfer of Care (Signed)
Immediate Anesthesia Transfer of Care Note  Patient: April Gamble  Procedure(s) Performed: Procedure(s) (LRB): LAPAROSCOPIC BILATERAL TUBAL LIGATION WITH CAUTERY (Bilateral) INTRAUTERINE DEVICE (IUD) REMOVAL (N/A)  Patient Location: PACU  Anesthesia Type: General  Level of Consciousness: awake, sedated, patient cooperative and responds to stimulation  Airway & Oxygen Therapy: Patient Spontanous Breathing and Patient connected to 02 FM   Post-op Assessment: Report given to PACU RN, Post -op Vital signs reviewed and stable and Patient moving all extremities  Post vital signs: Reviewed and stable  Complications: No apparent anesthesia complications

## 2021-11-09 NOTE — Progress Notes (Signed)
No change to H&P. Proceed with L/S BTL with IUD removal.   Mitchel Honour, DO

## 2021-11-11 ENCOUNTER — Encounter (HOSPITAL_COMMUNITY): Payer: Self-pay | Admitting: Obstetrics & Gynecology
